# Patient Record
Sex: Female | Born: 1935 | Race: White | Hispanic: No
Health system: Southern US, Community
[De-identification: ages and names within clinical notes are randomized; demographics above are authoritative.]

---

## 2007-09-20 ENCOUNTER — Emergency Department: Payer: Self-pay | Admitting: Emergency Medicine

## 2007-09-20 ENCOUNTER — Other Ambulatory Visit: Payer: Self-pay

## 2007-09-23 ENCOUNTER — Ambulatory Visit: Payer: Self-pay | Admitting: Unknown Physician Specialty

## 2008-06-29 ENCOUNTER — Ambulatory Visit: Payer: Self-pay | Admitting: Podiatry

## 2009-07-16 ENCOUNTER — Emergency Department: Payer: Self-pay | Admitting: Emergency Medicine

## 2009-07-18 ENCOUNTER — Inpatient Hospital Stay: Payer: Self-pay | Admitting: Internal Medicine

## 2010-04-28 ENCOUNTER — Observation Stay: Payer: Self-pay

## 2011-01-31 ENCOUNTER — Other Ambulatory Visit: Payer: Self-pay | Admitting: Podiatry

## 2011-04-25 ENCOUNTER — Inpatient Hospital Stay: Payer: Self-pay | Admitting: Unknown Physician Specialty

## 2011-05-02 DIAGNOSIS — N390 Urinary tract infection, site not specified: Secondary | ICD-10-CM

## 2011-05-02 DIAGNOSIS — S72009A Fracture of unspecified part of neck of unspecified femur, initial encounter for closed fracture: Secondary | ICD-10-CM

## 2011-05-02 DIAGNOSIS — F05 Delirium due to known physiological condition: Secondary | ICD-10-CM

## 2011-05-02 DIAGNOSIS — I2510 Atherosclerotic heart disease of native coronary artery without angina pectoris: Secondary | ICD-10-CM

## 2011-05-10 DIAGNOSIS — L22 Diaper dermatitis: Secondary | ICD-10-CM

## 2011-06-01 DIAGNOSIS — E119 Type 2 diabetes mellitus without complications: Secondary | ICD-10-CM

## 2011-06-01 DIAGNOSIS — I10 Essential (primary) hypertension: Secondary | ICD-10-CM

## 2011-06-01 DIAGNOSIS — I2510 Atherosclerotic heart disease of native coronary artery without angina pectoris: Secondary | ICD-10-CM

## 2011-06-25 ENCOUNTER — Inpatient Hospital Stay: Payer: Self-pay | Admitting: Specialist

## 2011-06-26 ENCOUNTER — Telehealth: Payer: Self-pay | Admitting: *Deleted

## 2011-06-26 NOTE — Telephone Encounter (Signed)
Was sent to ER and admitted for pyelonephritis

## 2011-06-26 NOTE — Telephone Encounter (Signed)
Triage Record Num: [id number] Operator: Audelia Hives Patient Name: Felicia Casey Call Date & Time: 06/25/2011 7:03:58PM Patient Phone: [phone] PCP: Patient Gender: Female PCP Fax : Patient DOB: 02/24/1917 [facility] Name: [facility] Reason for Call: temp 101/3. elevated BP Emergent call to RN, after dinner pt started with fever of 101.3 ax, has a thready pulse, and BP 198/82, RR 22, pulse rate 90, pt is shaking and wants to send to the ED. Unable to get O2 sat on pt. States giving Tylenol at this time. Advised to send to ED and notify family and compliant. Protocol(s) Used: Office Note Recommended Outcome per Protocol: Information Noted and Sent to Office Reason for Outcome: Caller information to office Care Advice: ~ 06/25/2011 7:21:34PM Page 1 of 1 [email or web address]_[email or web address]

## 2011-07-03 DIAGNOSIS — N390 Urinary tract infection, site not specified: Secondary | ICD-10-CM

## 2011-07-03 DIAGNOSIS — S7290XA Unspecified fracture of unspecified femur, initial encounter for closed fracture: Secondary | ICD-10-CM

## 2011-07-03 DIAGNOSIS — I2510 Atherosclerotic heart disease of native coronary artery without angina pectoris: Secondary | ICD-10-CM

## 2011-07-03 DIAGNOSIS — E119 Type 2 diabetes mellitus without complications: Secondary | ICD-10-CM

## 2011-07-04 DIAGNOSIS — L97509 Non-pressure chronic ulcer of other part of unspecified foot with unspecified severity: Secondary | ICD-10-CM

## 2011-07-04 DIAGNOSIS — E1169 Type 2 diabetes mellitus with other specified complication: Secondary | ICD-10-CM

## 2011-07-20 DIAGNOSIS — R509 Fever, unspecified: Secondary | ICD-10-CM

## 2011-07-25 ENCOUNTER — Telehealth: Payer: Self-pay | Admitting: *Deleted

## 2011-07-25 NOTE — Telephone Encounter (Signed)
[facility] Triage Call Report Triage Record Num: [id number] Operator: Elita Boone Patient Name: Felicia Casey Call Date & Time: 07/22/2011 11:23:30AM Patient Phone: [phone] PCP: Patient Gender: Female PCP Fax : Patient DOB: [birth year] Practice Name: [facility] Reason for Call: [name], , calling regarding . PCP is . Callback number is [phone]. Laura,LPN/ [facility] is call about a UA results, positive nitrate and leukocytedone on 08/27. Urine has large amt of blood. Pt is afebrile. Standing orders given for UTI symptoms and no fever. Obtain U/A C&S and start Ceftin 250 mg 1 po BID x 7 days or until C&S is reported and facility to call PCP next business day. Protocol(s) Used: Medication Question Calls, No Triage (Adults) Recommended Outcome per Protocol: Provide Information or Advice Only Reason for Outcome: Caller has medication question only and triager answers question Care Advice: ~ 07/22/2011 11:37:06AM Page 1 of 1 [email or web address]_[email or web address]

## 2011-07-25 NOTE — Telephone Encounter (Signed)
Noted Will review at [facility]

## 2011-08-07 ENCOUNTER — Telehealth: Payer: Self-pay | Admitting: *Deleted

## 2011-08-07 NOTE — Telephone Encounter (Signed)
Seen today at [facility] Better today Will await results of urine but not clear Rx appropriate even if positive

## 2011-08-07 NOTE — Telephone Encounter (Signed)
[facility] Triage Call Report Triage Record Num: [id number] Operator: Alphonsa Overall Patient Name: Felicia Casey Call Date & Time: 08/06/2011 5:28:43PM Patient Phone: [phone] PCP: Tillman Abide Patient Gender: Female PCP Fax : [phone] Patient DOB: [birth year] Practice Name: [facility] Reason for Call: [name], LPN, , calling regarding . PCP is Alphonsus Sias ([facility]), Garnetta Buddy number is [phone]. [name] LPN/[facility] calling about confusion recent UTI. Onset 08/06/11 new confusion. Pt finished course of Ceftin on 07/29/11 for UTI. No UTI symptoms noted. 98.8 temp ax at 1640 after 1 hour Tylenol. BP 198/80, 22R, P72. 95percent room air, Glucose 135. Headache dull 08/06/11. DNR. ED per Dr Caryl Never for more evaluation. [name] aware and will discuss with family. Protocol(s) Used: Confusion, Disorientation, Agitation Recommended Outcome per Protocol: Activate EMS 911 Reason for Outcome: New or worsening confusion, disorientation or agitation AND fever, headache OR stiff neck Care Advice: ~ Protect the patient from falling or other harm. ~ Do not give the patient anything to eat or drink. ~ An adult should stay with the patient, preferably one trained in CPR. ~ IMMEDIATE ACTION Write down provider's name. List or place the following in a bag for transport with the patient: current prescription and/or nonprescription medications; alternative treatments, therapies and medications; and street drugs. ~ 08/06/2011 5:50:56PM Page 1 of 1 [email or web address]_[email or web address]

## 2011-08-08 ENCOUNTER — Ambulatory Visit: Payer: Self-pay | Admitting: Internal Medicine

## 2011-08-09 DIAGNOSIS — N3000 Acute cystitis without hematuria: Secondary | ICD-10-CM

## 2011-08-16 ENCOUNTER — Telehealth: Payer: Self-pay | Admitting: *Deleted

## 2011-08-16 MED ORDER — ALUMINUM HYDROXIDE EX OINT: TOPICAL_OINTMENT | Freq: Three times a day (TID) | CUTANEOUS | Status: AC | PRN

## 2011-08-16 NOTE — Telephone Encounter (Signed)
Okay to fill without refills She is no longer under my care since leaving [facility] but she was using this  Apply tid as directed to protect skin

## 2011-08-16 NOTE — Telephone Encounter (Signed)
rx sent to pharmacy by e-script  

## 2011-08-16 NOTE — Telephone Encounter (Signed)
Received faxed refill request.  This medication was not on med list, please advise.  Fax in your IN box.

## 2011-08-17 ENCOUNTER — Encounter: Payer: Self-pay | Admitting: Internal Medicine

## 2011-08-21 ENCOUNTER — Other Ambulatory Visit: Payer: Self-pay | Admitting: *Deleted

## 2011-08-21 MED ORDER — POLYVINYL ALCOHOL 1.4 % OP SOLN: 1.0000 [drp] | OPHTHALMIC | Status: AC | PRN

## 2011-08-29 ENCOUNTER — Other Ambulatory Visit: Payer: Self-pay | Admitting: *Deleted

## 2011-08-29 NOTE — Telephone Encounter (Signed)
Fax asking for Vitamin d2 50,000 unit capsule. I don't see this on her med list, actually we don't have a lot of information on this patient. Please advise, nursing home?

## 2011-08-30 NOTE — Telephone Encounter (Signed)
Faxed rx back to pharmacy letting them know she's no longer Dr.Letvak's patient, also sent same fax to [facility].

## 2011-08-30 NOTE — Telephone Encounter (Signed)
She has left [facility] and is at [facility] She is no longer under my care and they should send requests to [facility] ---or find out her new doctor

## 2011-09-01 ENCOUNTER — Emergency Department: Payer: Self-pay | Admitting: Emergency Medicine

## 2011-09-13 ENCOUNTER — Other Ambulatory Visit: Payer: Self-pay | Admitting: Podiatry

## 2011-09-20 ENCOUNTER — Other Ambulatory Visit: Payer: Self-pay | Admitting: Podiatry

## 2011-10-10 ENCOUNTER — Ambulatory Visit: Payer: Self-pay | Admitting: Podiatry

## 2011-12-08 ENCOUNTER — Emergency Department: Payer: Self-pay | Admitting: Emergency Medicine

## 2011-12-08 LAB — URINALYSIS, COMPLETE
Glucose,UR: NEGATIVE mg/dL (ref 0–75)
Nitrite: POSITIVE
RBC,UR: 110 /HPF (ref 0–5)
Specific Gravity: 1.012 (ref 1.003–1.030)
Squamous Epithelial: NONE SEEN
WBC UR: 2570 /HPF (ref 0–5)

## 2011-12-08 LAB — COMPREHENSIVE METABOLIC PANEL
Albumin: 2.6 g/dL — ABNORMAL LOW (ref 3.4–5.0)
Alkaline Phosphatase: 87 U/L (ref 50–136)
Anion Gap: 10 (ref 7–16)
BUN: 32 mg/dL — ABNORMAL HIGH (ref 7–18)
Bilirubin,Total: 0.4 mg/dL (ref 0.2–1.0)
Creatinine: 1.91 mg/dL — ABNORMAL HIGH (ref 0.60–1.30)
EGFR (Non-African Amer.): 26 — ABNORMAL LOW
Glucose: 105 mg/dL — ABNORMAL HIGH (ref 65–99)
Osmolality: 290 (ref 275–301)
Potassium: 5.1 mmol/L (ref 3.5–5.1)
Sodium: 142 mmol/L (ref 136–145)
Total Protein: 6.9 g/dL (ref 6.4–8.2)

## 2011-12-08 LAB — CBC
HCT: 27.1 % — ABNORMAL LOW (ref 35.0–47.0)
HGB: 9 g/dL — ABNORMAL LOW (ref 12.0–16.0)
MCH: 28.9 pg (ref 26.0–34.0)
MCHC: 33.1 g/dL (ref 32.0–36.0)
MCV: 87 fL (ref 80–100)
RDW: 17.3 % — ABNORMAL HIGH (ref 11.5–14.5)

## 2012-01-15 ENCOUNTER — Encounter: Payer: Self-pay | Admitting: Nurse Practitioner

## 2012-01-15 ENCOUNTER — Encounter: Payer: Self-pay | Admitting: Cardiothoracic Surgery

## 2012-01-19 ENCOUNTER — Encounter: Payer: Self-pay | Admitting: Cardiothoracic Surgery

## 2012-01-19 ENCOUNTER — Encounter: Payer: Self-pay | Admitting: Nurse Practitioner

## 2012-02-13 ENCOUNTER — Other Ambulatory Visit: Payer: Medicare Other

## 2012-02-15 ENCOUNTER — Other Ambulatory Visit: Payer: Medicare Other

## 2012-02-19 ENCOUNTER — Encounter: Payer: Self-pay | Admitting: Cardiothoracic Surgery

## 2012-02-19 ENCOUNTER — Encounter: Payer: Self-pay | Admitting: Nurse Practitioner

## 2012-02-22 ENCOUNTER — Ambulatory Visit: Payer: Medicare Other | Admitting: Internal Medicine

## 2012-02-27 LAB — WOUND CULTURE

## 2012-03-11 ENCOUNTER — Other Ambulatory Visit: Payer: Medicare Other

## 2012-03-20 ENCOUNTER — Encounter: Payer: Self-pay | Admitting: Cardiothoracic Surgery

## 2012-03-20 ENCOUNTER — Encounter: Payer: Self-pay | Admitting: Nurse Practitioner

## 2012-04-10 IMAGING — CT CT HEAD WITHOUT CONTRAST
2 series · 15 of 30 positions shown, 19 images · non-contrast
Comparison: none

REASON FOR EXAM: CVA
COMMENTS:   May transport without cardiac monitor

PROCEDURE:     CT  - CT HEAD WITHOUT CONTRAST  - December 08, 2011  [DATE]
RESULT:     Comparison:  09/20/2007
TECHNIQUE: Multiple axial images from the foramen magnum to the vertex were
obtained without IV contrast.

[Series 2: without · axial · non-contrast · 0.42mm/px · z∈[+296,+422]mm · 13 of 31 slices shown, 17 images]
[im 3/31  brain]
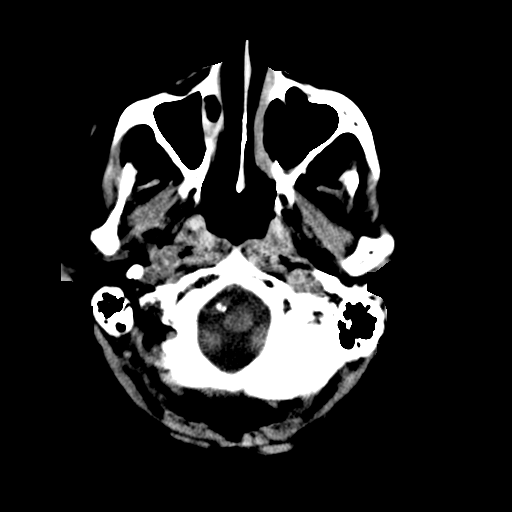
[im 3/31  bone]
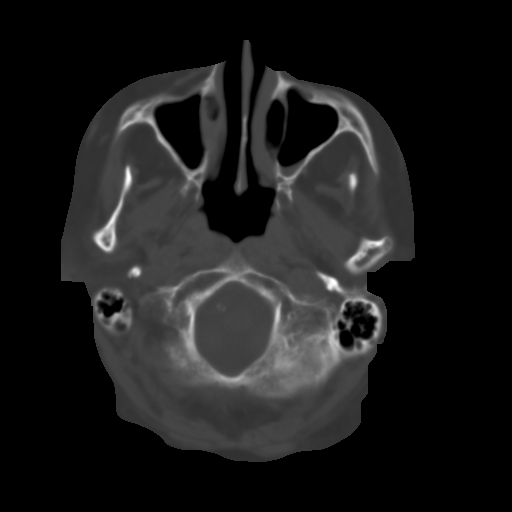
[im 5/31  brain]
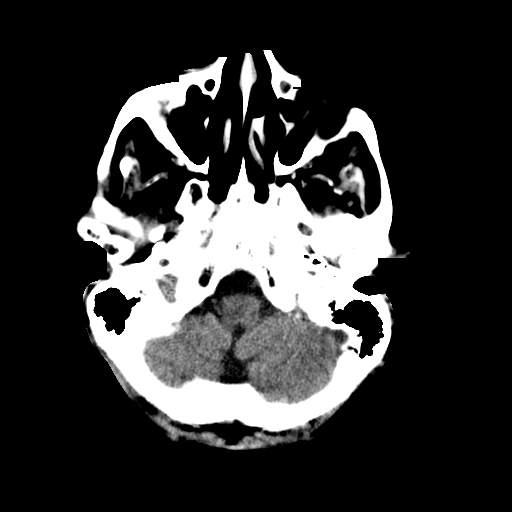
[im 7/31  brain]
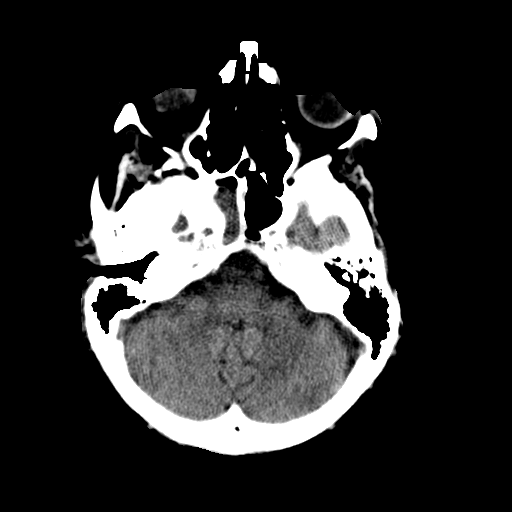
[im 9/31  brain]
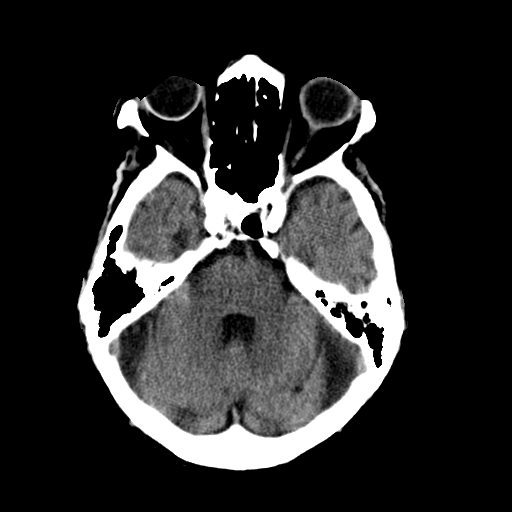
[im 11/31  brain]
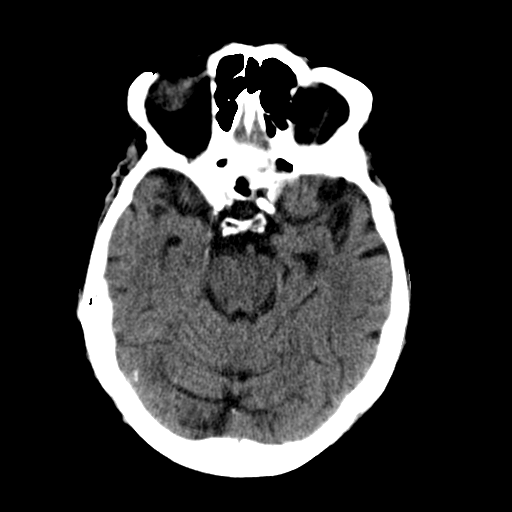
[im 11/31  bone]
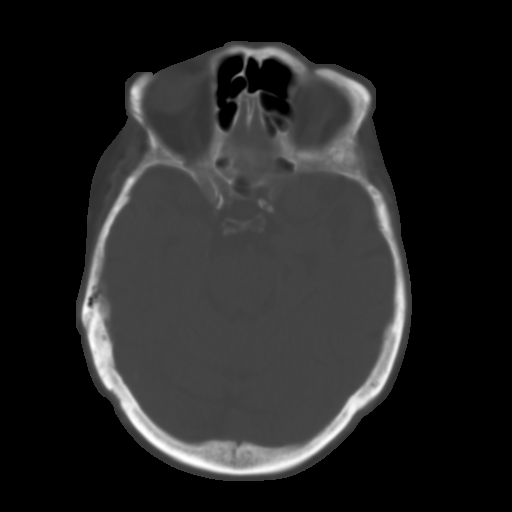
[im 13/31  brain]
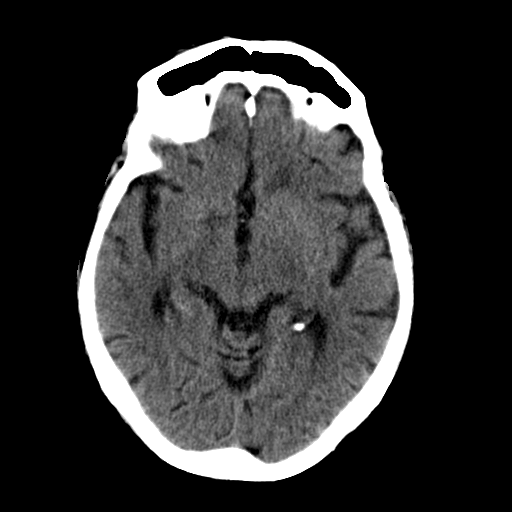
[im 16/31  brain]
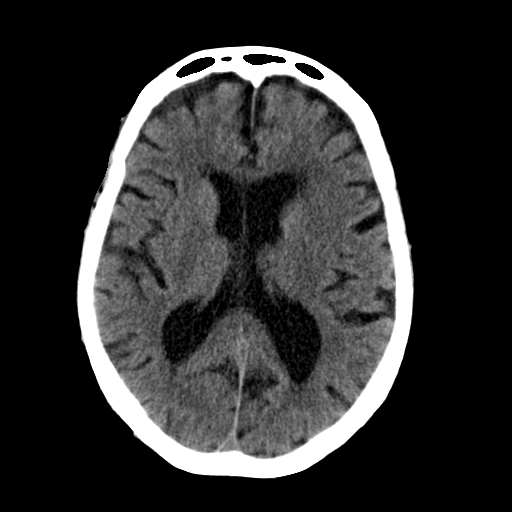
[im 18/31  brain]
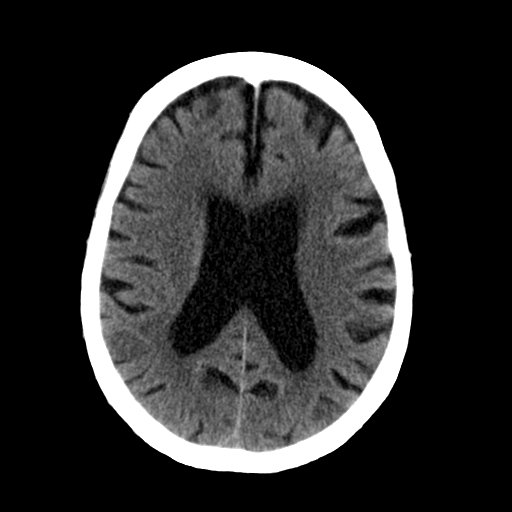
[im 20/31  brain]
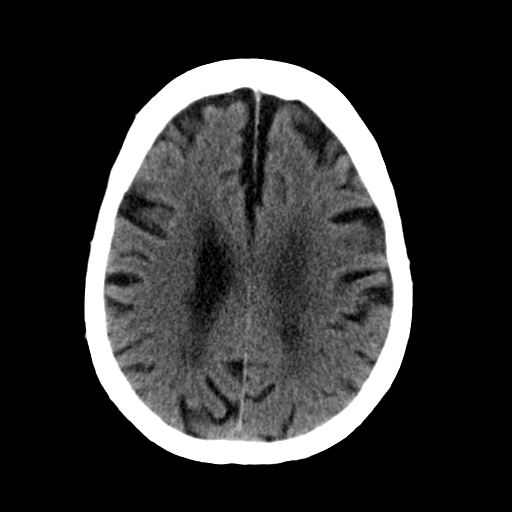
[im 20/31  bone]
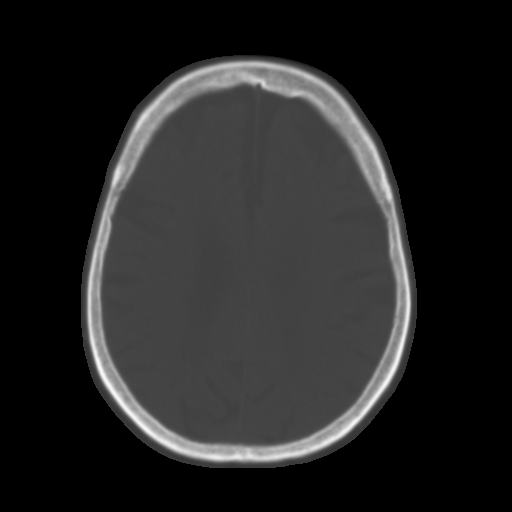
[im 22/31  brain]
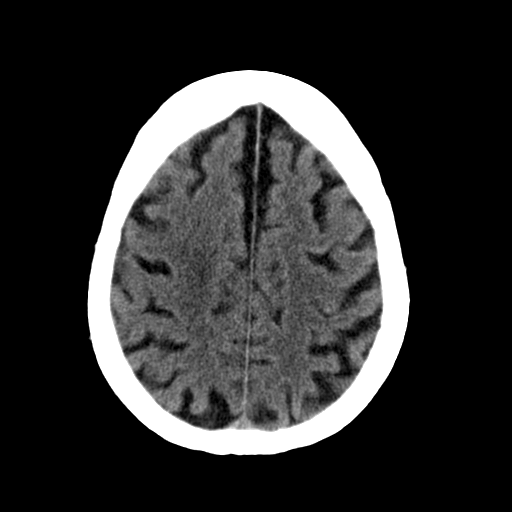
[im 24/31  brain]
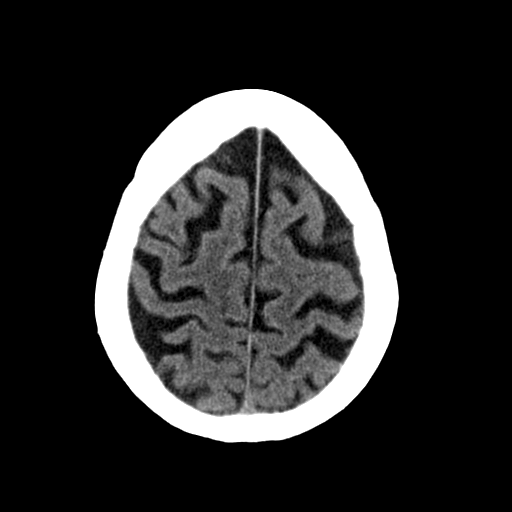
[im 26/31  brain]
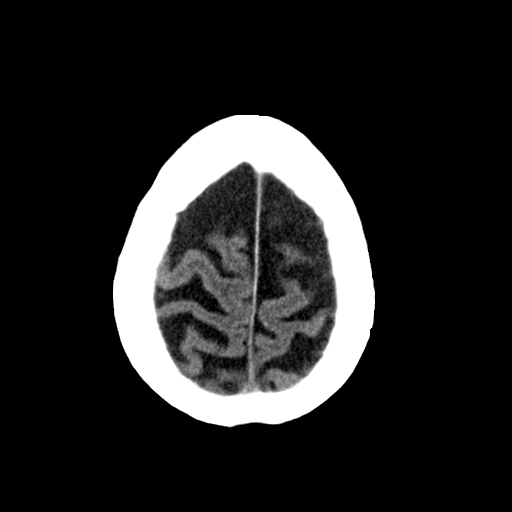
[im 28/31  brain]
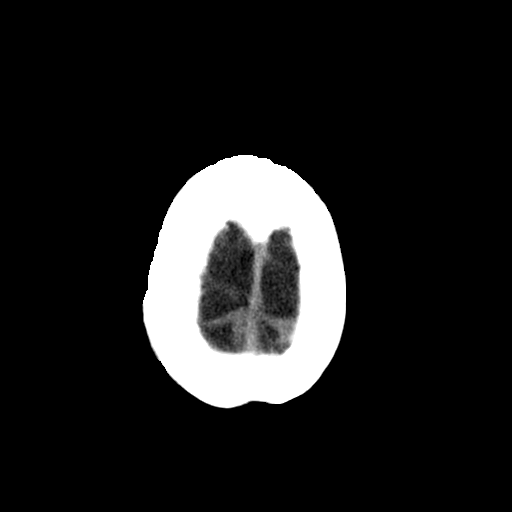
[im 28/31  bone]
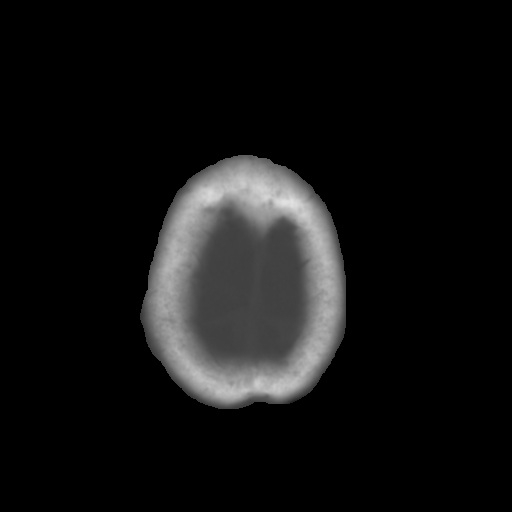

[Series 3: bone · axial · 0.42mm/px · z∈[+296,+316]mm · 2 of 31 slices shown]
[im 3/31  bone]
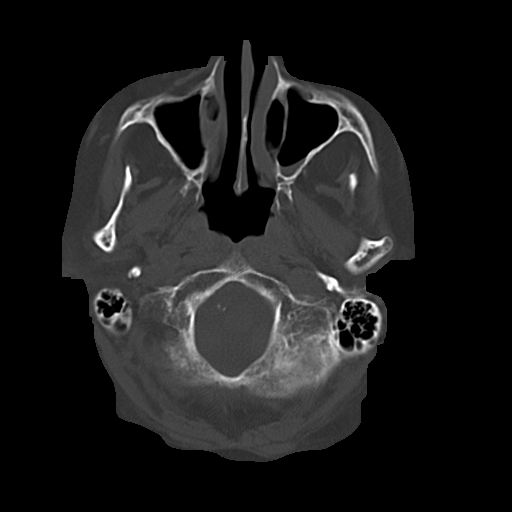
[im 7/31  bone]
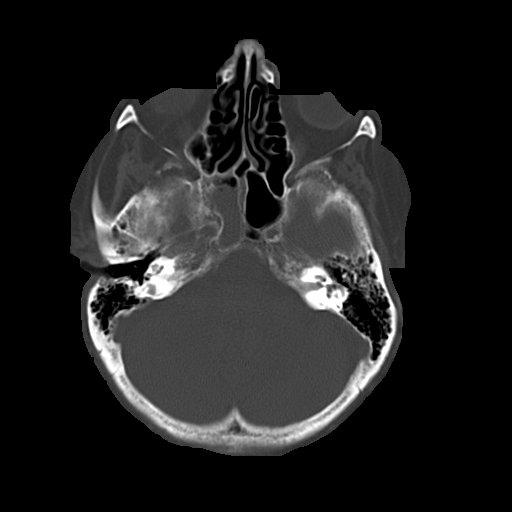

[15 of 30 positions shown; findings below may reference images not displayed]

FINDINGS: There is no evidence for mass effect, midline shift, or extra-axial fluid
collections. There is no evidence for space-occupying lesion, intracranial
hemorrhage, or cortical-based area of infarction. There is mild diffuse
cerebral atrophy, which is age-appropriate. Mild periventricular and
subcortical hypoattenuation is consistent with sequela of chronic
microangiopathy. There is an old small lacunar infarct in the right centrum
semiovale, similar to prior.

There is near complete opacification the right sphenoid sinus.

The osseous structures are unremarkable.
IMPRESSION: 1. No acute intracranial process.
2. Mild chronic microangiopathy.
3. Right sphenoid sinus disease.

CT can underestimate ischemia in the first 24 hours after the event. If
there is clinical concern for an acute infarct, a followup MRI or repeat CT
scan in 24 hours may provide additional information.

## 2012-05-03 ENCOUNTER — Encounter: Payer: Self-pay | Admitting: Cardiothoracic Surgery

## 2012-05-21 ENCOUNTER — Inpatient Hospital Stay: Payer: Self-pay

## 2012-05-21 LAB — URINALYSIS, COMPLETE: Specific Gravity: 1.016 (ref 1.003–1.030)

## 2012-05-21 LAB — COMPREHENSIVE METABOLIC PANEL
BUN: 37 mg/dL — ABNORMAL HIGH (ref 7–18)
Bilirubin,Total: 0.5 mg/dL (ref 0.2–1.0)
Chloride: 104 mmol/L (ref 98–107)
Co2: 24 mmol/L (ref 21–32)
Creatinine: 2.31 mg/dL — ABNORMAL HIGH (ref 0.60–1.30)
EGFR (African American): 21 — ABNORMAL LOW
EGFR (Non-African Amer.): 18 — ABNORMAL LOW
Osmolality: 279 (ref 275–301)
Potassium: 5.5 mmol/L — ABNORMAL HIGH (ref 3.5–5.1)
SGPT (ALT): 13 U/L
Sodium: 134 mmol/L — ABNORMAL LOW (ref 136–145)

## 2012-05-21 LAB — CBC
HCT: 27.2 % — ABNORMAL LOW (ref 35.0–47.0)
HGB: 8.6 g/dL — ABNORMAL LOW (ref 12.0–16.0)
MCH: 27.5 pg (ref 26.0–34.0)
MCHC: 31.6 g/dL — ABNORMAL LOW (ref 32.0–36.0)

## 2012-05-22 LAB — BASIC METABOLIC PANEL
Chloride: 107 mmol/L (ref 98–107)
Co2: 20 mmol/L — ABNORMAL LOW (ref 21–32)
Creatinine: 2.44 mg/dL — ABNORMAL HIGH (ref 0.60–1.30)
Osmolality: 287 (ref 275–301)
Sodium: 137 mmol/L (ref 136–145)

## 2012-05-23 LAB — BASIC METABOLIC PANEL
BUN: 34 mg/dL — ABNORMAL HIGH (ref 7–18)
Calcium, Total: 8.5 mg/dL (ref 8.5–10.1)
Chloride: 108 mmol/L — ABNORMAL HIGH (ref 98–107)
Creatinine: 2.08 mg/dL — ABNORMAL HIGH (ref 0.60–1.30)
Potassium: 4.6 mmol/L (ref 3.5–5.1)
Sodium: 137 mmol/L (ref 136–145)

## 2012-05-23 LAB — CBC WITH DIFFERENTIAL/PLATELET
Basophil %: 0.2 %
Eosinophil #: 0.3 10*3/uL (ref 0.0–0.7)
HGB: 6.3 g/dL — ABNORMAL LOW (ref 12.0–16.0)
Lymphocyte #: 1.2 10*3/uL (ref 1.0–3.6)
Lymphocyte %: 7.8 %
MCHC: 31.1 g/dL — ABNORMAL LOW (ref 32.0–36.0)
MCV: 87 fL (ref 80–100)
Neutrophil %: 85.5 %
Platelet: 139 10*3/uL — ABNORMAL LOW (ref 150–440)
RBC: 2.32 10*6/uL — ABNORMAL LOW (ref 3.80–5.20)
RDW: 17.4 % — ABNORMAL HIGH (ref 11.5–14.5)

## 2012-05-24 LAB — CBC WITH DIFFERENTIAL/PLATELET
Basophil #: 0 10*3/uL (ref 0.0–0.1)
Eosinophil #: 0.4 10*3/uL (ref 0.0–0.7)
HCT: 26.9 % — ABNORMAL LOW (ref 35.0–47.0)
HGB: 8.7 g/dL — ABNORMAL LOW (ref 12.0–16.0)
Lymphocyte #: 1.1 10*3/uL (ref 1.0–3.6)
Lymphocyte %: 7.9 %
MCH: 27.8 pg (ref 26.0–34.0)
Monocyte #: 0.8 x10 3/mm (ref 0.2–0.9)
Neutrophil %: 82.8 %
RBC: 3.11 10*6/uL — ABNORMAL LOW (ref 3.80–5.20)

## 2012-05-24 LAB — BASIC METABOLIC PANEL
BUN: 26 mg/dL — ABNORMAL HIGH (ref 7–18)
Calcium, Total: 8.9 mg/dL (ref 8.5–10.1)
EGFR (African American): 31 — ABNORMAL LOW
EGFR (Non-African Amer.): 26 — ABNORMAL LOW
Osmolality: 280 (ref 275–301)
Potassium: 4.3 mmol/L (ref 3.5–5.1)

## 2012-05-25 LAB — BASIC METABOLIC PANEL
Anion Gap: 4 — ABNORMAL LOW (ref 7–16)
Calcium, Total: 8.5 mg/dL (ref 8.5–10.1)
Chloride: 111 mmol/L — ABNORMAL HIGH (ref 98–107)
Co2: 25 mmol/L (ref 21–32)
Creatinine: 1.65 mg/dL — ABNORMAL HIGH (ref 0.60–1.30)
EGFR (African American): 31 — ABNORMAL LOW
Glucose: 104 mg/dL — ABNORMAL HIGH (ref 65–99)
Osmolality: 283 (ref 275–301)
Potassium: 4.5 mmol/L (ref 3.5–5.1)
Sodium: 140 mmol/L (ref 136–145)

## 2012-05-25 LAB — CBC WITH DIFFERENTIAL/PLATELET
Basophil %: 0.3 %
Eosinophil %: 6.9 %
HGB: 8.6 g/dL — ABNORMAL LOW (ref 12.0–16.0)
Lymphocyte #: 1.2 10*3/uL (ref 1.0–3.6)
MCH: 27.9 pg (ref 26.0–34.0)
MCHC: 32.3 g/dL (ref 32.0–36.0)
Monocyte #: 0.7 x10 3/mm (ref 0.2–0.9)
Monocyte %: 7.5 %
Neutrophil #: 7 10*3/uL — ABNORMAL HIGH (ref 1.4–6.5)
Neutrophil %: 72.4 %
RBC: 3.08 10*6/uL — ABNORMAL LOW (ref 3.80–5.20)
WBC: 9.7 10*3/uL (ref 3.6–11.0)

## 2012-05-25 LAB — VANCOMYCIN, TROUGH: Vancomycin, Trough: 11 ug/mL (ref 10–20)

## 2012-05-26 LAB — URINE CULTURE

## 2012-05-26 LAB — BASIC METABOLIC PANEL
Anion Gap: 6 — ABNORMAL LOW (ref 7–16)
Chloride: 110 mmol/L — ABNORMAL HIGH (ref 98–107)
Co2: 24 mmol/L (ref 21–32)
Creatinine: 1.52 mg/dL — ABNORMAL HIGH (ref 0.60–1.30)
EGFR (African American): 34 — ABNORMAL LOW
Sodium: 140 mmol/L (ref 136–145)

## 2012-05-26 LAB — CBC WITH DIFFERENTIAL/PLATELET
Basophil #: 0 10*3/uL (ref 0.0–0.1)
Basophil %: 0.3 %
HCT: 26.9 % — ABNORMAL LOW (ref 35.0–47.0)
HGB: 8.8 g/dL — ABNORMAL LOW (ref 12.0–16.0)
Lymphocyte %: 17 %
MCHC: 32.7 g/dL (ref 32.0–36.0)
Monocyte #: 0.9 x10 3/mm (ref 0.2–0.9)
Monocyte %: 9.9 %
Neutrophil #: 5.9 10*3/uL (ref 1.4–6.5)
Neutrophil %: 66.6 %
RDW: 16.3 % — ABNORMAL HIGH (ref 11.5–14.5)
WBC: 8.9 10*3/uL (ref 3.6–11.0)

## 2012-05-27 LAB — BASIC METABOLIC PANEL
Anion Gap: 10 (ref 7–16)
BUN: 22 mg/dL — ABNORMAL HIGH (ref 7–18)
Chloride: 106 mmol/L (ref 98–107)
Creatinine: 1.38 mg/dL — ABNORMAL HIGH (ref 0.60–1.30)
EGFR (African American): 38 — ABNORMAL LOW
Glucose: 115 mg/dL — ABNORMAL HIGH (ref 65–99)
Osmolality: 280 (ref 275–301)
Sodium: 138 mmol/L (ref 136–145)

## 2012-05-27 LAB — CULTURE, BLOOD (SINGLE)

## 2012-05-27 LAB — CBC WITH DIFFERENTIAL/PLATELET
Basophil #: 0 10*3/uL (ref 0.0–0.1)
Basophil %: 0.3 %
Eosinophil %: 5.1 %
Lymphocyte #: 2.1 10*3/uL (ref 1.0–3.6)
Lymphocyte %: 19.8 %
MCHC: 32.4 g/dL (ref 32.0–36.0)
MCV: 88 fL (ref 80–100)
Platelet: 167 10*3/uL (ref 150–440)
RBC: 3.29 10*6/uL — ABNORMAL LOW (ref 3.80–5.20)
RDW: 16 % — ABNORMAL HIGH (ref 11.5–14.5)
WBC: 10.5 10*3/uL (ref 3.6–11.0)

## 2012-05-28 LAB — BASIC METABOLIC PANEL
Anion Gap: 10 (ref 7–16)
BUN: 19 mg/dL — ABNORMAL HIGH (ref 7–18)
Calcium, Total: 8.8 mg/dL (ref 8.5–10.1)
Chloride: 108 mmol/L — ABNORMAL HIGH (ref 98–107)
Co2: 21 mmol/L (ref 21–32)
EGFR (African American): 35 — ABNORMAL LOW
EGFR (Non-African Amer.): 30 — ABNORMAL LOW
Osmolality: 281 (ref 275–301)
Potassium: 4.2 mmol/L (ref 3.5–5.1)

## 2012-05-28 LAB — CBC WITH DIFFERENTIAL/PLATELET
Basophil #: 0 10*3/uL (ref 0.0–0.1)
Basophil %: 0.3 %
Eosinophil #: 0.5 10*3/uL (ref 0.0–0.7)
HCT: 27.5 % — ABNORMAL LOW (ref 35.0–47.0)
HGB: 9 g/dL — ABNORMAL LOW (ref 12.0–16.0)
Lymphocyte %: 16.4 %
MCHC: 32.6 g/dL (ref 32.0–36.0)
MCV: 86 fL (ref 80–100)
Monocyte #: 0.9 x10 3/mm (ref 0.2–0.9)
Monocyte %: 8.8 %
Neutrophil #: 7.1 10*3/uL — ABNORMAL HIGH (ref 1.4–6.5)
Neutrophil %: 69.2 %
RBC: 3.19 10*6/uL — ABNORMAL LOW (ref 3.80–5.20)
WBC: 10.2 10*3/uL (ref 3.6–11.0)

## 2012-05-30 ENCOUNTER — Encounter: Payer: Self-pay | Admitting: Cardiothoracic Surgery

## 2012-06-06 ENCOUNTER — Encounter: Payer: Self-pay | Admitting: Internal Medicine

## 2012-06-06 LAB — BASIC METABOLIC PANEL WITH GFR
Anion Gap: 8
BUN: 28 mg/dL — ABNORMAL HIGH
Calcium, Total: 9.1 mg/dL
Chloride: 105 mmol/L
Co2: 25 mmol/L
Creatinine: 1.7 mg/dL — ABNORMAL HIGH
EGFR (African American): 30 — ABNORMAL LOW
EGFR (Non-African Amer.): 26 — ABNORMAL LOW
Glucose: 127 mg/dL — ABNORMAL HIGH
Osmolality: 283
Potassium: 4.3 mmol/L
Sodium: 138 mmol/L

## 2012-06-13 ENCOUNTER — Emergency Department: Payer: Self-pay | Admitting: Emergency Medicine

## 2012-06-13 LAB — BASIC METABOLIC PANEL
Anion Gap: 11 (ref 7–16)
BUN: 33 mg/dL — ABNORMAL HIGH (ref 7–18)
Calcium, Total: 9.4 mg/dL (ref 8.5–10.1)
Chloride: 108 mmol/L — ABNORMAL HIGH (ref 98–107)
Co2: 22 mmol/L (ref 21–32)
Creatinine: 1.49 mg/dL — ABNORMAL HIGH (ref 0.60–1.30)
Potassium: 4.9 mmol/L (ref 3.5–5.1)
Sodium: 141 mmol/L (ref 136–145)

## 2012-06-16 LAB — URINALYSIS, COMPLETE
Nitrite: NEGATIVE
Ph: 5 (ref 4.5–8.0)
Protein: 100
RBC,UR: 2887 /HPF (ref 0–5)
Squamous Epithelial: 5
Transitional Epi: 3

## 2012-06-18 LAB — URINE CULTURE

## 2012-06-20 ENCOUNTER — Encounter: Payer: Self-pay | Admitting: Internal Medicine

## 2012-07-16 ENCOUNTER — Inpatient Hospital Stay: Payer: Self-pay

## 2012-07-17 LAB — CBC WITH DIFFERENTIAL/PLATELET
Basophil #: 0 10*3/uL (ref 0.0–0.1)
Basophil %: 0.4 %
HCT: 21.7 % — ABNORMAL LOW (ref 35.0–47.0)
HGB: 7.1 g/dL — ABNORMAL LOW (ref 12.0–16.0)
Lymphocyte %: 11 %
MCH: 28.2 pg (ref 26.0–34.0)
MCHC: 32.7 g/dL (ref 32.0–36.0)
MCV: 86 fL (ref 80–100)
Monocyte #: 1.2 x10 3/mm — ABNORMAL HIGH (ref 0.2–0.9)
Neutrophil #: 9.6 10*3/uL — ABNORMAL HIGH (ref 1.4–6.5)
RDW: 17 % — ABNORMAL HIGH (ref 11.5–14.5)
WBC: 12.3 10*3/uL — ABNORMAL HIGH (ref 3.6–11.0)

## 2012-07-17 LAB — URINALYSIS, COMPLETE
Bilirubin,UR: NEGATIVE
Glucose,UR: NEGATIVE mg/dL (ref 0–75)
Ketone: NEGATIVE
Nitrite: NEGATIVE
Ph: 5 (ref 4.5–8.0)
RBC,UR: 663 /HPF (ref 0–5)
Squamous Epithelial: 4
WBC UR: 3958 /HPF (ref 0–5)

## 2012-07-17 LAB — BASIC METABOLIC PANEL
BUN: 38 mg/dL — ABNORMAL HIGH (ref 7–18)
Calcium, Total: 8.6 mg/dL (ref 8.5–10.1)
Creatinine: 2.77 mg/dL — ABNORMAL HIGH (ref 0.60–1.30)
EGFR (African American): 17 — ABNORMAL LOW
EGFR (Non-African Amer.): 14 — ABNORMAL LOW
Glucose: 100 mg/dL — ABNORMAL HIGH (ref 65–99)
Osmolality: 281 (ref 275–301)
Potassium: 4.9 mmol/L (ref 3.5–5.1)

## 2012-07-18 LAB — CBC WITH DIFFERENTIAL/PLATELET
Basophil #: 0.1 10*3/uL (ref 0.0–0.1)
Basophil %: 0.5 %
Eosinophil #: 0.3 10*3/uL (ref 0.0–0.7)
Eosinophil %: 2.4 %
HCT: 20.1 % — ABNORMAL LOW (ref 35.0–47.0)
HGB: 6.6 g/dL — ABNORMAL LOW (ref 12.0–16.0)
Lymphocyte #: 1.3 10*3/uL (ref 1.0–3.6)
Lymphocyte %: 11 %
Monocyte #: 1.2 x10 3/mm — ABNORMAL HIGH (ref 0.2–0.9)
Monocyte %: 10 %
Neutrophil #: 9.1 10*3/uL — ABNORMAL HIGH (ref 1.4–6.5)
Platelet: 143 10*3/uL — ABNORMAL LOW (ref 150–440)
RBC: 2.33 10*6/uL — ABNORMAL LOW (ref 3.80–5.20)
RDW: 17.1 % — ABNORMAL HIGH (ref 11.5–14.5)
WBC: 12 10*3/uL — ABNORMAL HIGH (ref 3.6–11.0)

## 2012-07-18 LAB — BASIC METABOLIC PANEL
BUN: 38 mg/dL — ABNORMAL HIGH (ref 7–18)
Calcium, Total: 8.4 mg/dL — ABNORMAL LOW (ref 8.5–10.1)
Co2: 19 mmol/L — ABNORMAL LOW (ref 21–32)
EGFR (African American): 15 — ABNORMAL LOW
EGFR (Non-African Amer.): 13 — ABNORMAL LOW
Glucose: 59 mg/dL — ABNORMAL LOW (ref 65–99)
Osmolality: 277 (ref 275–301)
Potassium: 4.5 mmol/L (ref 3.5–5.1)

## 2012-07-19 LAB — CBC WITH DIFFERENTIAL/PLATELET
Basophil #: 0 10*3/uL (ref 0.0–0.1)
Basophil %: 0.4 %
Eosinophil #: 0.6 10*3/uL (ref 0.0–0.7)
Eosinophil %: 4.6 %
Lymphocyte %: 15.3 %
MCH: 28.5 pg (ref 26.0–34.0)
MCHC: 33 g/dL (ref 32.0–36.0)
MCV: 86 fL (ref 80–100)
Monocyte #: 0.9 x10 3/mm (ref 0.2–0.9)
Monocyte %: 7 %
Neutrophil %: 72.7 %
Platelet: 161 10*3/uL (ref 150–440)
RBC: 2.81 10*6/uL — ABNORMAL LOW (ref 3.80–5.20)
WBC: 12.5 10*3/uL — ABNORMAL HIGH (ref 3.6–11.0)

## 2012-07-19 LAB — BASIC METABOLIC PANEL
Calcium, Total: 8.6 mg/dL (ref 8.5–10.1)
Chloride: 108 mmol/L — ABNORMAL HIGH (ref 98–107)
Co2: 20 mmol/L — ABNORMAL LOW (ref 21–32)
EGFR (African American): 18 — ABNORMAL LOW
Osmolality: 276 (ref 275–301)
Sodium: 136 mmol/L (ref 136–145)

## 2012-07-20 LAB — URINE CULTURE

## 2012-07-21 LAB — CBC WITH DIFFERENTIAL/PLATELET
Basophil #: 0 10*3/uL (ref 0.0–0.1)
Eosinophil #: 0.5 10*3/uL (ref 0.0–0.7)
Eosinophil %: 5.2 %
HCT: 24.2 % — ABNORMAL LOW (ref 35.0–47.0)
Lymphocyte #: 1.4 10*3/uL (ref 1.0–3.6)
Lymphocyte %: 15.6 %
MCHC: 33.9 g/dL (ref 32.0–36.0)
Monocyte %: 10.2 %
Neutrophil #: 6 10*3/uL (ref 1.4–6.5)
Platelet: 168 10*3/uL (ref 150–440)
RBC: 2.79 10*6/uL — ABNORMAL LOW (ref 3.80–5.20)
RDW: 16.5 % — ABNORMAL HIGH (ref 11.5–14.5)
WBC: 8.7 10*3/uL (ref 3.6–11.0)

## 2012-07-21 LAB — BASIC METABOLIC PANEL
BUN: 31 mg/dL — ABNORMAL HIGH (ref 7–18)
Chloride: 110 mmol/L — ABNORMAL HIGH (ref 98–107)
EGFR (Non-African Amer.): 21 — ABNORMAL LOW
Glucose: 104 mg/dL — ABNORMAL HIGH (ref 65–99)
Osmolality: 284 (ref 275–301)
Potassium: 4.4 mmol/L (ref 3.5–5.1)
Sodium: 139 mmol/L (ref 136–145)

## 2012-07-22 LAB — CBC WITH DIFFERENTIAL/PLATELET
Basophil %: 0.6 %
Eosinophil #: 0.6 10*3/uL (ref 0.0–0.7)
Eosinophil %: 7.5 %
HCT: 28.5 % — ABNORMAL LOW (ref 35.0–47.0)
HGB: 9.7 g/dL — ABNORMAL LOW (ref 12.0–16.0)
Lymphocyte %: 25.6 %
MCV: 86 fL (ref 80–100)
Monocyte #: 0.6 x10 3/mm (ref 0.2–0.9)
Monocyte %: 7.9 %
Neutrophil #: 4.5 10*3/uL (ref 1.4–6.5)
RBC: 3.32 10*6/uL — ABNORMAL LOW (ref 3.80–5.20)
RDW: 16 % — ABNORMAL HIGH (ref 11.5–14.5)
WBC: 7.7 10*3/uL (ref 3.6–11.0)

## 2012-07-22 LAB — BASIC METABOLIC PANEL
Calcium, Total: 9.6 mg/dL (ref 8.5–10.1)
Creatinine: 1.94 mg/dL — ABNORMAL HIGH (ref 0.60–1.30)
EGFR (African American): 25 — ABNORMAL LOW
EGFR (Non-African Amer.): 22 — ABNORMAL LOW
Sodium: 138 mmol/L (ref 136–145)

## 2012-07-24 LAB — BASIC METABOLIC PANEL
Anion Gap: 7 (ref 7–16)
Calcium, Total: 9.4 mg/dL (ref 8.5–10.1)
Chloride: 108 mmol/L — ABNORMAL HIGH (ref 98–107)
Creatinine: 2.08 mg/dL — ABNORMAL HIGH (ref 0.60–1.30)
EGFR (Non-African Amer.): 20 — ABNORMAL LOW
Glucose: 117 mg/dL — ABNORMAL HIGH (ref 65–99)
Osmolality: 284 (ref 275–301)
Potassium: 4.6 mmol/L (ref 3.5–5.1)

## 2012-07-24 LAB — CBC WITH DIFFERENTIAL/PLATELET
Basophil #: 0.1 10*3/uL (ref 0.0–0.1)
Basophil %: 0.5 %
Eosinophil #: 0.5 10*3/uL (ref 0.0–0.7)
Eosinophil %: 5.4 %
HCT: 27.8 % — ABNORMAL LOW (ref 35.0–47.0)
HGB: 9.3 g/dL — ABNORMAL LOW (ref 12.0–16.0)
Lymphocyte %: 18.3 %
MCHC: 33.6 g/dL (ref 32.0–36.0)
MCV: 85 fL (ref 80–100)
Monocyte #: 0.7 x10 3/mm (ref 0.2–0.9)
Monocyte %: 7.6 %
Neutrophil #: 6.5 10*3/uL (ref 1.4–6.5)

## 2012-07-25 ENCOUNTER — Encounter: Payer: Self-pay | Admitting: Internal Medicine

## 2012-08-03 LAB — URINALYSIS, COMPLETE
Bilirubin,UR: NEGATIVE
Glucose,UR: NEGATIVE mg/dL (ref 0–75)
Nitrite: NEGATIVE
RBC,UR: 212 /HPF (ref 0–5)
Specific Gravity: 1.009 (ref 1.003–1.030)
WBC UR: 489 /HPF (ref 0–5)

## 2012-08-06 LAB — URINE CULTURE

## 2012-08-14 ENCOUNTER — Encounter: Payer: Self-pay | Admitting: Cardiothoracic Surgery

## 2012-08-14 ENCOUNTER — Encounter: Payer: Self-pay | Admitting: Nurse Practitioner

## 2012-08-20 ENCOUNTER — Encounter: Payer: Self-pay | Admitting: Nurse Practitioner

## 2012-08-21 ENCOUNTER — Inpatient Hospital Stay: Payer: Self-pay

## 2012-08-21 LAB — URINALYSIS, COMPLETE
Bilirubin,UR: NEGATIVE
Glucose,UR: NEGATIVE mg/dL (ref 0–75)
Ketone: NEGATIVE
Ph: 6 (ref 4.5–8.0)
Specific Gravity: 1.013 (ref 1.003–1.030)
Squamous Epithelial: NONE SEEN

## 2012-08-21 LAB — COMPREHENSIVE METABOLIC PANEL
Alkaline Phosphatase: 126 U/L (ref 50–136)
Anion Gap: 8 (ref 7–16)
Calcium, Total: 9.6 mg/dL (ref 8.5–10.1)
Chloride: 109 mmol/L — ABNORMAL HIGH (ref 98–107)
EGFR (African American): 16 — ABNORMAL LOW
EGFR (Non-African Amer.): 14 — ABNORMAL LOW
Potassium: 5.7 mmol/L — ABNORMAL HIGH (ref 3.5–5.1)
SGOT(AST): 16 U/L (ref 15–37)
Sodium: 137 mmol/L (ref 136–145)

## 2012-08-21 LAB — CBC
HGB: 10.5 g/dL — ABNORMAL LOW (ref 12.0–16.0)
MCH: 29.4 pg (ref 26.0–34.0)
MCV: 86 fL (ref 80–100)
Platelet: 137 10*3/uL — ABNORMAL LOW (ref 150–440)
RBC: 3.57 10*6/uL — ABNORMAL LOW (ref 3.80–5.20)
RDW: 17 % — ABNORMAL HIGH (ref 11.5–14.5)
WBC: 14.9 10*3/uL — ABNORMAL HIGH (ref 3.6–11.0)

## 2012-08-21 LAB — LIPASE, BLOOD: Lipase: 198 U/L (ref 73–393)

## 2012-08-22 ENCOUNTER — Ambulatory Visit: Payer: Self-pay | Admitting: Internal Medicine

## 2012-08-22 LAB — BASIC METABOLIC PANEL
Anion Gap: 11 (ref 7–16)
BUN: 66 mg/dL — ABNORMAL HIGH (ref 7–18)
Chloride: 110 mmol/L — ABNORMAL HIGH (ref 98–107)
Creatinine: 2.5 mg/dL — ABNORMAL HIGH (ref 0.60–1.30)
Glucose: 96 mg/dL (ref 65–99)
Osmolality: 295 (ref 275–301)
Sodium: 138 mmol/L (ref 136–145)

## 2012-08-22 LAB — CBC WITH DIFFERENTIAL/PLATELET
Basophil %: 0.2 %
Eosinophil #: 0.1 10*3/uL (ref 0.0–0.7)
Eosinophil %: 1.3 %
HGB: 9.4 g/dL — ABNORMAL LOW (ref 12.0–16.0)
Lymphocyte #: 2.5 10*3/uL (ref 1.0–3.6)
Lymphocyte %: 23.7 %
MCH: 28.9 pg (ref 26.0–34.0)
MCHC: 33.7 g/dL (ref 32.0–36.0)
MCV: 86 fL (ref 80–100)
Monocyte #: 1.1 x10 3/mm — ABNORMAL HIGH (ref 0.2–0.9)
Neutrophil #: 6.9 10*3/uL — ABNORMAL HIGH (ref 1.4–6.5)
Neutrophil %: 64.9 %
Platelet: 120 10*3/uL — ABNORMAL LOW (ref 150–440)

## 2012-08-23 ENCOUNTER — Encounter: Payer: Self-pay | Admitting: Internal Medicine

## 2012-08-23 LAB — BASIC METABOLIC PANEL
BUN: 61 mg/dL — ABNORMAL HIGH (ref 7–18)
Calcium, Total: 8.7 mg/dL (ref 8.5–10.1)
Chloride: 112 mmol/L — ABNORMAL HIGH (ref 98–107)
Creatinine: 2.41 mg/dL — ABNORMAL HIGH (ref 0.60–1.30)
EGFR (African American): 19 — ABNORMAL LOW
EGFR (Non-African Amer.): 17 — ABNORMAL LOW
Potassium: 4.9 mmol/L (ref 3.5–5.1)
Sodium: 139 mmol/L (ref 136–145)

## 2012-08-23 LAB — CBC WITH DIFFERENTIAL/PLATELET
Basophil %: 0.5 %
Eosinophil %: 1.9 %
HGB: 9.3 g/dL — ABNORMAL LOW (ref 12.0–16.0)
Lymphocyte %: 22.5 %
Monocyte %: 8.3 %
Neutrophil %: 66.8 %
RBC: 3.23 10*6/uL — ABNORMAL LOW (ref 3.80–5.20)
WBC: 10 10*3/uL (ref 3.6–11.0)

## 2012-08-24 LAB — BASIC METABOLIC PANEL
BUN: 50 mg/dL — ABNORMAL HIGH (ref 7–18)
Calcium, Total: 8.4 mg/dL — ABNORMAL LOW (ref 8.5–10.1)
Chloride: 115 mmol/L — ABNORMAL HIGH (ref 98–107)
Co2: 19 mmol/L — ABNORMAL LOW (ref 21–32)
Osmolality: 297 (ref 275–301)
Potassium: 4.5 mmol/L (ref 3.5–5.1)
Sodium: 142 mmol/L (ref 136–145)

## 2012-08-24 LAB — VANCOMYCIN, TROUGH: Vancomycin, Trough: 12 ug/mL (ref 10–20)

## 2012-08-26 LAB — CULTURE, BLOOD (SINGLE)

## 2012-09-07 ENCOUNTER — Encounter: Payer: Self-pay | Admitting: Internal Medicine

## 2012-09-07 LAB — URINALYSIS, COMPLETE
Bilirubin,UR: NEGATIVE
Glucose,UR: 150 mg/dL (ref 0–75)
Ketone: NEGATIVE
Nitrite: POSITIVE
Ph: 6 (ref 4.5–8.0)
Protein: 100
RBC,UR: 1854 /HPF (ref 0–5)
Specific Gravity: 1.013 (ref 1.003–1.030)
Squamous Epithelial: NONE SEEN

## 2012-09-13 ENCOUNTER — Encounter: Payer: Self-pay | Admitting: Cardiothoracic Surgery

## 2012-09-20 ENCOUNTER — Encounter: Payer: Self-pay | Admitting: Nurse Practitioner

## 2012-09-20 ENCOUNTER — Encounter: Payer: Self-pay | Admitting: Cardiothoracic Surgery

## 2012-09-20 ENCOUNTER — Encounter: Payer: Self-pay | Admitting: Internal Medicine

## 2012-09-20 ENCOUNTER — Ambulatory Visit: Payer: Self-pay | Admitting: Internal Medicine

## 2012-12-23 IMAGING — CR DG CHEST 2V
1 series · 2 of 2 positions shown · non-contrast
Comparison: none

REASON FOR EXAM: cough
COMMENTS:

[Series 1: w chest lat · 0.14mm/px · 2 of 2 slices shown]
[im 1/2]
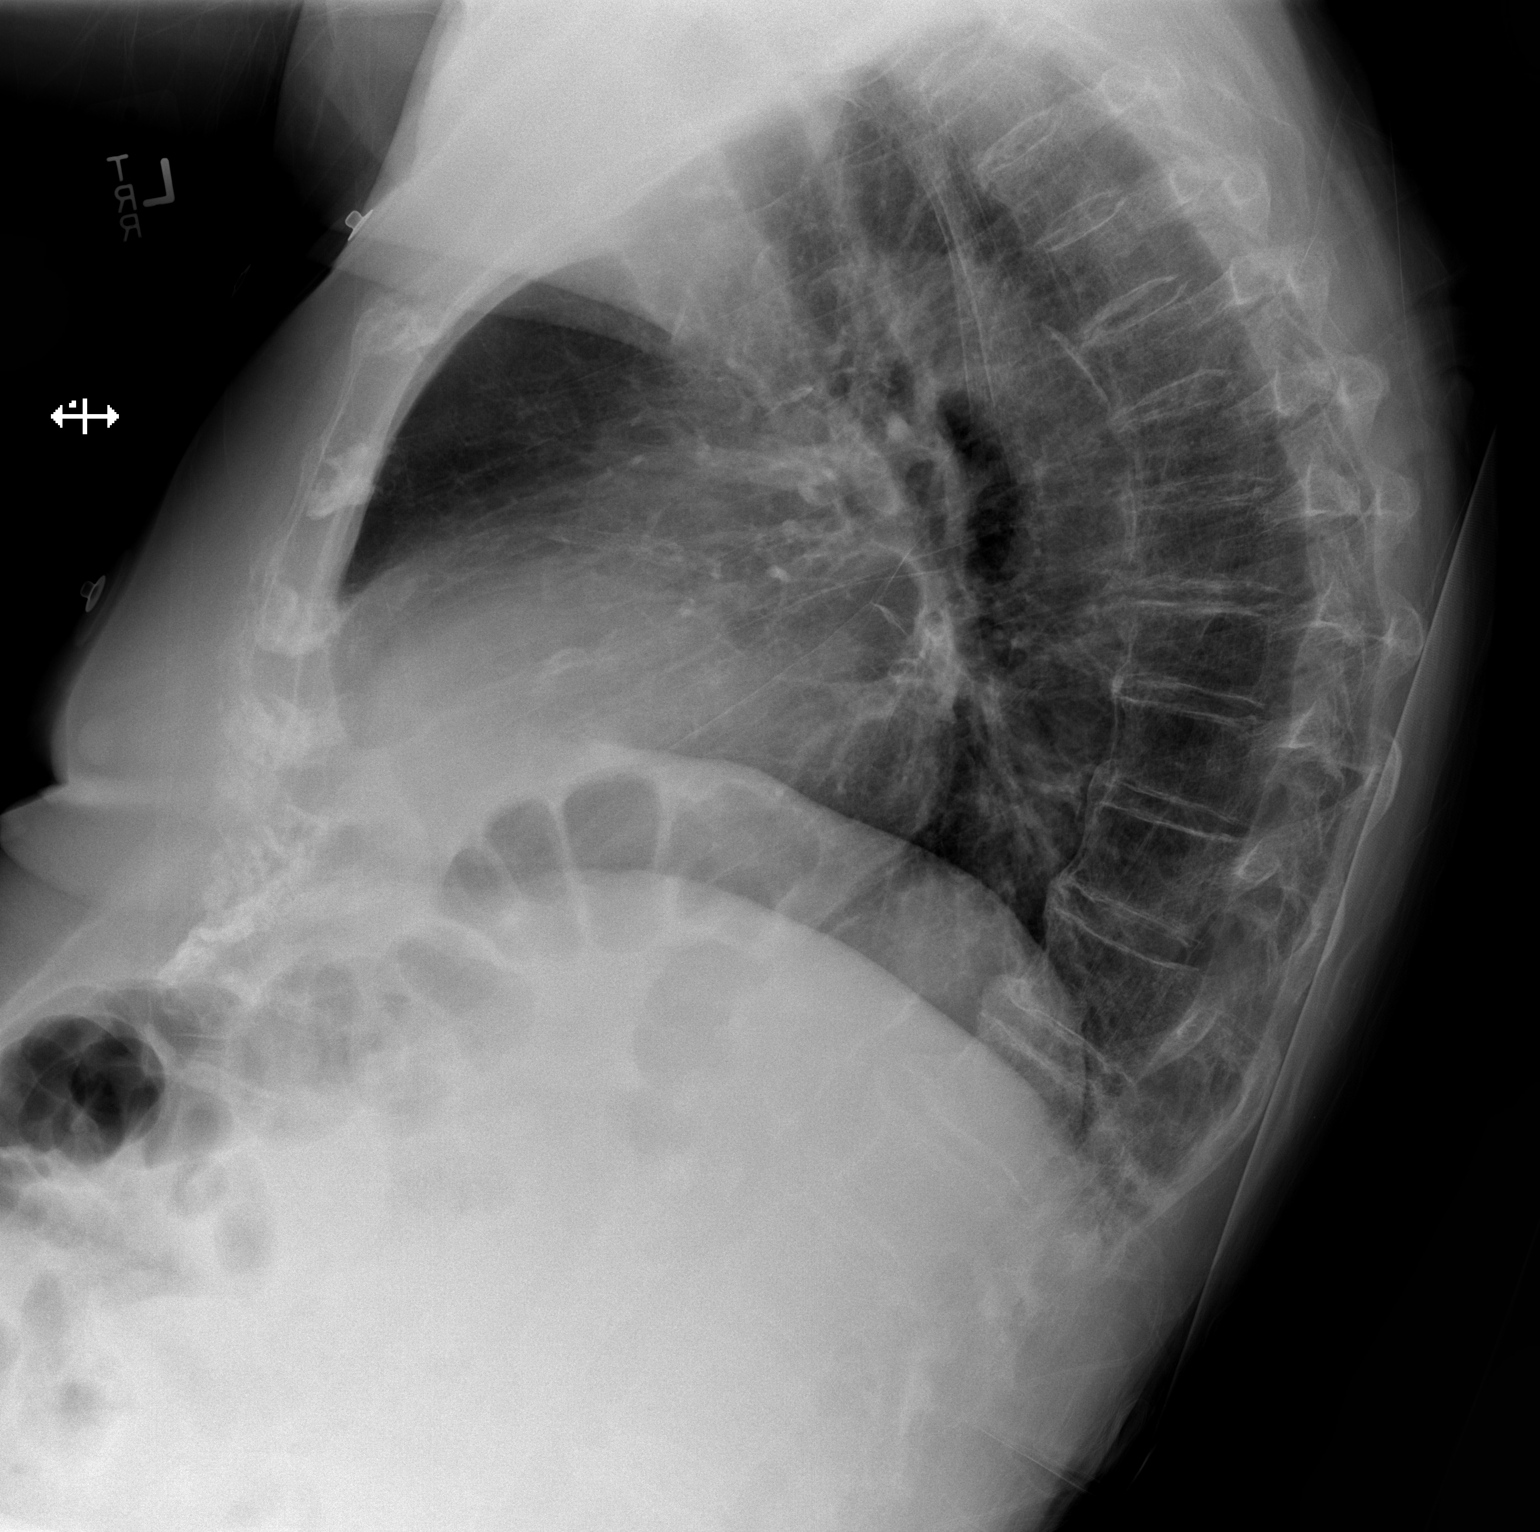
[im 2/2]
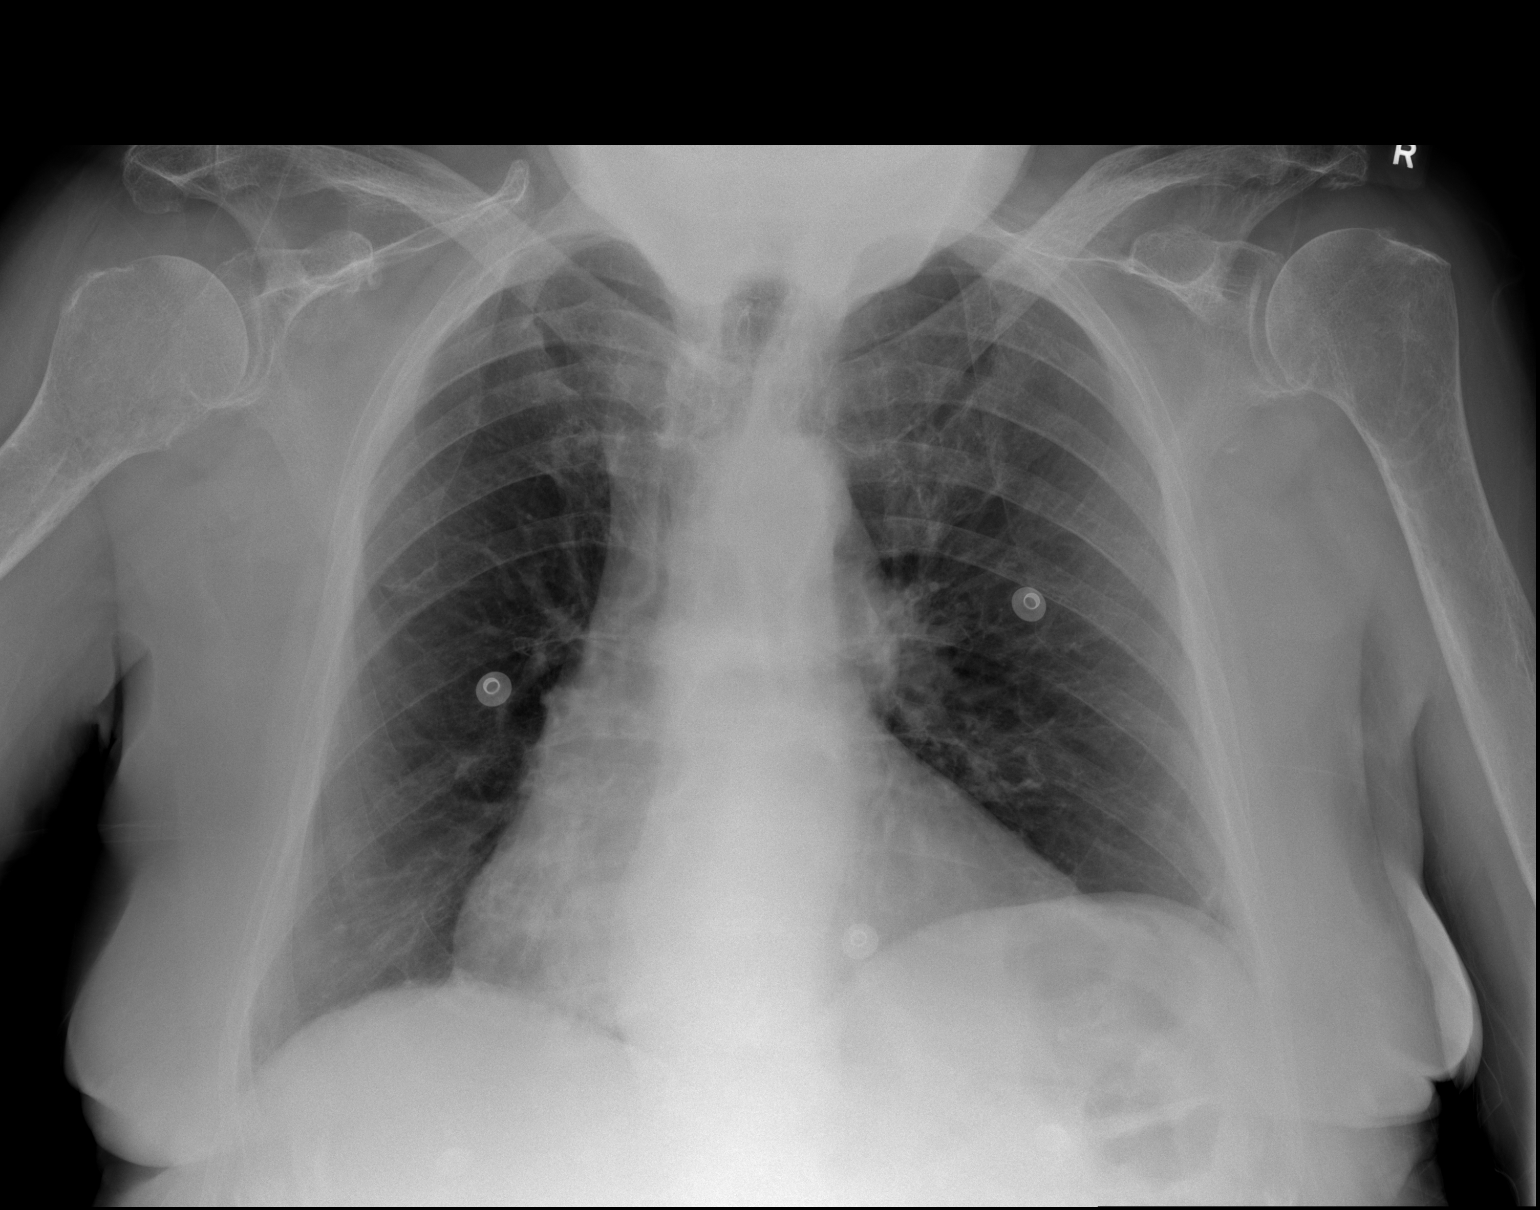

[2 of 2 positions shown; findings below may reference images not displayed]

PROCEDURE:     DXR - DXR CHEST PA (OR AP) AND LATERAL  - August 21, 2012  [DATE]

RESULT:     Comparison is made to a study 13 June, 2012.

The lungs are adequately inflated. The left hemidiaphragm diaphragm is
chronically higher than the right. There is no pleural effusion. The cardiac
silhouette is normal in size. The pulmonary vascularity is not engorged. The
bony thorax exhibits no acute abnormalities. The mediastinum is normal in
width. There is mild tortuosity of the descending thoracic aorta.
IMPRESSION: 1. There is no evidence of pneumonia or pulmonary edema or pleural effusions.
2. The cardiac silhouette is mildly enlarged but the pulmonary vascularity
is not engorged.

[REDACTED]

## 2013-07-21 DEATH — deceased

## 2015-03-09 NOTE — H&P (Signed)
PATIENT NAME:  Felicia Casey, Felicia Casey MR#:  [id number] DATE OF BIRTH:  January 13, 1917  DATE OF ADMISSION:  08/21/2012  PRIMARY CARE PHYSICIAN: Dr. Sampson Goon, [facility]   REASON FOR ADMISSION: Recurrent urinary tract infection with Methicillin-resistant Staphylococcus aureus and Enterococcus faecalis.   HISTORY OF PRESENT ILLNESS: Felicia Casey is a very nice 81 year old female who is actually in very good condition for her age. She has been treated on several occasions for urinary tract infections due to Methicillin-resistant Staphylococcus aureus and Enterococcus faecalis with trimethoprim/sulfamethoxazole, Augmentin, ciprofloxacin, and clindamycin. So far she has been taking doxycycline and Macrobid but she has not had good results. She is still having elevated white blood count and for the past three to four days the patient has been complaining of abdominal pain, malaise, generalized weakness, and mild dysuria. The patient has been seen by Dr. Sampson Goon who repeated a set of tests and she was told to come to the ER to get treated. On her sensitivities we can say that both bacteria are sensitive to linezolid for what the patient has been started on that medication. At this moment she is stable and she has been hemodynamically stable. She has been overall afebrile with a temperature of 98. She has not been tachycardic or tachypneic and her blood pressure has been actually on the high side. She is just not getting much better with the treatment with oral antibiotics. The patient is here in the company of her daughter and they have been doing okay so far.   REVIEW OF SYSTEMS: CONSTITUTIONAL: Denies any fever. Positive fatigue. Positive generalized weakness. Positive aches and pains of neck, behind the head, back, both hips, and abdomen. Denies any weight loss or weight gain. HEENT: The patient denies any blurry vision or double vision. She does have glaucoma and uses several drops. Denies any changes in her  vision status. HEENT: Denies any tinnitus. She has some mild congestion with a little bit of postnasal drip and hacking cough with clear phlegm. RESPIRATORY: Positive cough as mentioned above. Denies any wheezing, hemoptysis or pneumonia in the past. CARDIOVASCULAR: Denies any orthopnea, edema, chest pain, or palpitations. GI: Positive for mild abdominal pain, crampy, and little bit of GERD. Denies any melena or rectal bleeding. No jaundice. GU: Denies any hematuria. No kidney stones. Positive dysuria. Positive erythema of the groin due to yeast. Denies any vaginal bleeding. Negative polyuria, polydipsia, or polyphagia. No cold or heat intolerance. HEMATOLOGIC/LYMPHATIC: Positive anemia in the past but no easy bleeding or bruising. MUSCULOSKELETAL: As mentioned above, aches and pains in neck, back, hips. No gout. No edema of joints. NEUROLOGIC: No numbness, weakness. No CVA. No TIA. PSYCHIATRIC: Negative for insomnia, anxiety, or depression.   PAST MEDICAL HISTORY:  1. Type II diabetes, well controlled.  2. Hypertension.  3. Coronary artery disease, status post three stents.  4. Peripheral neuropathy.  5. Diabetic foot ulcers, chronic.  6. Anemia of chronic disease.  7. Hypothyroidism.  8. Chronic kidney disease.  9. Gastroesophageal reflux.  10. Glaucoma.  11. History of prior TIA.   ALLERGIES: The patient is allergic to ibuprofen and sulfa.   PAST SURGICAL HISTORY:  1. Three stents due to coronary artery disease. No history of heart attack.  2. Hysterectomy.  3. Tonsillectomy.  4. Femur fracture.   SOCIAL HISTORY: The patient lives here at assisted living facility and her daughters help take care of her. She is a widow. She has never smoked and she does not drink. She has been independent up  to now.   FAMILY HISTORY: Positive for tuberculosis and pneumonia in both parents. No coronary artery disease. No cancer.   MEDICATIONS:  1. Vitamin D 5000 units once a month.  2. Multivitamin.   3. Ophthalmic Refresh Eyes.  4. Nystop, nystatin, apply to affected areas. 5. Norvasc 2.5 mg once daily.  6. Mucinex 600 mg every 12 hours. 7. Metoprolol 100 mg once a day. 8. Tylenol p.r.n.  9. Macrobid 100 mg twice daily.  10. Levothyroxine 88 mcg once daily.  11. Latanoprost 0.005% each eye once daily.  12. Glipizide 2.5 mg once daily.  13. Fluticasone 50 mcg inhaler two sprays once daily.  14. Doxycycline 100 mg twice daily.  15. Lipitor 10 mg once daily.  16. Aspirin 81 mg once daily.  17. Albuterol p.r.n.   PHYSICAL EXAMINATION:   VITAL SIGNS: Blood pressure 178/77, has been as high as 195/81, pulse 67, respiratory rate 22, temperature 98.4.   GENERAL: The patient is alert and oriented x3 in no acute distress. No respiratory distress. Hemodynamically stable. Comfortable sitting on the bed. Denies any discomfort at this moment.   HEENT: Pupils are equal and reactive. Extraocular movements intact. Mucosa moist. No oropharyngeal exudates. No thrush. Pink conjunctivae. Anicteric sclerae.   NECK: Supple. No JVD. No thyromegaly. No adenopathy. Carotid bruits are negative. Trachea is central. Range of motion within normal limits.   CARDIOVASCULAR: Regular rate and rhythm. No murmurs, rubs, or gallops are appreciated. Apical heart tones are nondisplaced.   LUNGS: Clear without any significant wheezing or rhonchi, although there is a little bit of crackles in both bases and some upper respiratory secretions but overall the lung fields are clear. No dullness to percussion.   ABDOMEN: Soft, nontender, nondistended. No hepatosplenomegaly. No masses.   GENITAL: Negative for external lesions. The patient wears a diaper and she has some intertrigo of the groin.   EXTREMITIES: No edema, no cyanosis, no clubbing. Pulses +2. Deep tendon reflexes x2.   SKIN: She has heel ulcers that are covered at this moment. There are no signs of infection when examined.   NEUROLOGIC: Cranial nerves II  through XII are intact. Strength seems to be equal in four extremities. No significant focal findings.   LYMPHATICS: Negative for lymphadenopathy in neck, supraclavicular, or epitrochlear.   PSYCH: Mood is normal without any signs of depression or anxiety.   MUSCULOSKELETAL: No significant joint effusions or deformity.   LABORATORY, DIAGNOSTIC, AND RADIOLOGICAL DATA: Creatinine 2.76, potassium 5.7, CO2 20, white count 14.9, hemoglobin 10.5, platelets 137. Urinalysis done in the past has shown Methicillin-resistant and Enterococcus faecalis. They are both sensitive to linezolid today. Her white blood cells in the urine are more than 1000.   Chest x-ray without any significant acute abnormality or signs of consolidation.   ASSESSMENT AND PLAN: This is a 81 year old female with history of multiple medical problems including coronary artery disease, diabetes, hypertension, peripheral neuropathy, chronic kidney disease stage III, and diabetic foot ulcers who presented here to the ER sent by primary care physician, Dr. Sampson GoonFitzgerald, due to recurrent and failure to treat urinary tract infection with Methicillin-resistant Staphylococcus aureus and Enterococcus faecalis.  1. Urinary tract infection, recurrent, and failure to treat as an outpatient with oral medications. The patient was later treated with Augmentin and ciprofloxacin, now with doxycycline and Macrobid. Her white count is still 14,000 and she has significant systemic symptoms like malaise, decreased appetite, abdominal pain, aching muscles, etc. for which she is being treated now with linezolid which  will cover both bacteria. Due to the fact that the patient is on linezolid, we have to watch for pancytopenia. At this moment her white count is elevated, her hemoglobin is 10, and her platelets are 136 for what we are going to follow-up labs. She is not taking any other medications that will create any interaction with linezolid in the sense of  serotonin syndrome but if she is going to be put on any antidepressant or other medications like Benadryl, we need to watch closely. She is a patient of Dr. Sampson Goon and we are going to transfer care in the morning.  2. Coronary artery disease, is asymptomatic. The patient is on aspirin, beta-blocker, and statin.  3. Diabetes. It has been well controlled. Will continue current therapy and also insulin sliding scale.  4. Dyslipidemia. Continue Lipitor.  5. Hypertension. Continue metoprolol and Norvasc.  6. Intertrigo. Continue nystatin topically.  7. Upper respiratory symptoms due to probably viral infection versus allergies. At this moment we are going to treat symptomatically with Mucinex. Continue fluticasone for treatment of asthma/reactive airway disease as well as albuterol.  8. Glaucoma. Continue treatment with eyedrops.  9. Hypothyroidism. Continue treatment with levothyroxine.  10. Other medical problems seem to be stable.   CODE STATUS: The patient is a DO NOT RESUSCITATE.   TIME SPENT: I spent about 50 minutes with this patient today. Will transfer care to Dr. Sampson Goon in the morning.   ____________________________ Felipa Furnace, MD rsg:drc D: 08/21/2012 21:43:19 ET T: 08/22/2012 08:26:30 ET JOB#: [id number]  cc: Felipa Furnace, MD, <Dictator> Stann Mainland. Sampson Goon, MD Regan Rakers Juanda Chance MD ELECTRONICALLY SIGNED 08/22/2012 14:16

## 2015-03-09 NOTE — Discharge Summary (Signed)
PATIENT NAME:  Felicia NakayamaBRUNER, Kelsa C MR#:  [id number] DATE OF BIRTH:  1917-05-29  DATE OF ADMISSION:  07/16/2012 DATE OF DISCHARGE:  07/24/2012  ADDENDUM:  Original Discharge Summary was dated 07/20/2012.   HOSPITAL COURSE: The patient was kept in the hospital over the weekend for altered mental status likely due to a dose or two of Haldol she received.  She also continued to work with physical therapy.  Her mental status was markedly improved, and on the day of discharge she was afebrile with vital signs stable.  She was eating well.  She had no active complaints.    DISCHARGE LABORATORY, DIAGNOSTIC AND RADIOLOGICAL DATA: On the day of discharge, white blood count was 9.5, hemoglobin 9.3, platelets of 163.  Her discharge BUN was 29, creatinine 2.08.  The rest of her basic panel was within normal limits.   DISCHARGE DIAGNOSES: (Addendum) 1. Urinary tract infection: Her urinary tract infection has been treated, but she will require Augmentin, Ciprofloxacin and fluconazole until 07/29/2012, at which point these antibiotics can be stopped. 2. Diabetes: Her sugars had been low, so we had held her Actos. We have also decreased her glipizide, and she will remain on 2.5 mg to be given in the morning.   DISCHARGE MEDICATIONS: The rest of her discharge medications are the same as on her prior dictated Discharge Summary.   FOLLOWUP: The patient should follow up with me within 1 to 2 weeks of discharge.  ____________________________ Stann Mainlandavid P. Sampson GoonFitzgerald, MD dpf:cbb D: 07/24/2012 12:08:12 ET T: 07/24/2012 12:13:19 ET JOB#: [id number]  cc: Stann Mainlandavid P. Sampson GoonFitzgerald, MD, <Dictator> DAVID Sampson GoonFITZGERALD MD ELECTRONICALLY SIGNED 07/29/2012 22:11

## 2015-03-09 NOTE — Discharge Summary (Signed)
PATIENT NAME:  Felicia Casey, Felicia Casey MR#:  [id number] DATE OF BIRTH:  [birth year]  DATE OF ADMISSION:  08/21/2012 DATE OF DISCHARGE:  08/26/2012  PRIMARY CARE PHYSICIAN: Felicia Braunavid Calven Gilkes, MD  DISCHARGE DIAGNOSES:  1. Urinary tract infection.  2. Acute renal failure.  3. Altered mental status.  4. Hypertension.  5. Diabetes.  6. Debility.  HISTORY OF PRESENT ILLNESS: Ms. Felicia Casey is a 81 year old who resides at an assisted living facility. She has had issues with recurrent urinary tract infections. Recently she was diagnosed with MRSA and enterococcus urinary tract infection and was on antibiotics for this. Over several days prior to admission, she developed upper respiratory infection symptoms and decreased oral intake. She then developed fevers and altered mental status. She came to the emergency room where she was found to have altered mental status. Her white count was elevated at 14.9. Urinalysis was floridly positive. Her creatinine was elevated to 2.76. Her potassium was also elevated at 5.7. She was admitted for further management.   HOSPITAL COURSE BY ISSUE: 1. Urinary tract infection. She has had multiple pathogens grow in the past. Urine culture obtained at this admission Klebsiella pneumoniae. Fortunately, it was a very sensitive organism sensitive to cefazolin, ceftriaxone, ciprofloxacin, gentamicin, imipenem, Bactrim, and levofloxacin and resistant to nitrofurantoin and ampicillin.  Of note, she had been on doxycycline and Macrobid as an outpatient for the MRSA and enterococcus urinary tract infection. During this hospitalization, she was initially started on Zyvox. However, this was changed to vancomycin and ceftriaxone was added. She received five days of vancomycin which was discontinued on 08/25/2012. She received five days of ceftriaxone which was also discontinued on 08/25/2012. She has been transitioned to oral Keflex, 500 mg three times daily.  2. Acute renal failure and dehydration.  The patient received IV fluids however, her BUN was quite elevated at first. With continued IV fluids her mental status improved and her creatinine decreased to 1.99.  3. Altered mental status. This improved with IV fluids and treatment of her urinary infection.  4. Debility. The patient was seen by physical therapy who recommended skilled nursing facility short-term rehab.  5. Diabetes. The patient's glipizide was held as her sugars were well controlled and she was not eating. We will restart it now as an outpatient.  6. Also for hypertension, the patient has continued elevated blood pressure, but she had been recently started on Norvasc recently and we will continue that.  DISCHARGE MEDICATIONS:  1. Keflex 500 mg three times daily for five days.  2. Glipizide 2.5 mg once a day.  3. Levothyroxine 88 mcg orally once a day.  4. Nystop powder to the groin once a day for fungal infection.  5. Aspirin 81 mg once a day.  6. Fluticasone 50 mg spray two sprays once a day.  7. Metoprolol 100 mg once a day of the extended-release.  8. Tab-A-Vite oral vitamin once a day.  9. Refresh ophthalmologic solution one drop to each eye twice a day.  10. Nystatin triamcinolone cream apply to affected area under breast two times a day.  11. Tylenol 650 mg extended-release 1 tablet three times daily for pain.  12. Atorvastatin 110 mg once a day.  13. Lansoprazole 0.005% one drop to each eye at bedtime.  14. Norvasc 2.5 mg once a day.  15. Mucinex 1 tablet every 12 hours.  DISCHARGE DISPOSITION: The patient will be discharged to a skilled nursing facility for physical therapy. The plan there will be to have her return  home with 24/7 care.   DISCHARGE FOLLOWUP: The patient will followup with Dr. Dareen Casey at [facility]. She will also follow up with Dr. Sampson Casey after she goes home.        ACTIVITY: As tolerated.  TIME SPENT: 40 minutes. ____________________________ Stann Mainland. Felicia Goon,  MD dpf:slb D: 08/26/2012 08:57:38 ET T: 08/26/2012 09:14:49 ET JOB#: [id number]  cc: Stann Mainland. Felicia Goon, MD, <Dictator> Jaelon Gatley Felicia Goon MD ELECTRONICALLY SIGNED 08/27/2012 21:46

## 2015-03-09 NOTE — Discharge Summary (Signed)
PATIENT NAME:  Felicia Casey, Felicia Casey MR#:  [id number] DATE OF BIRTH:  1917-04-25  DATE OF ADMISSION:  07/16/2012 DATE OF DISCHARGE:  07/20/2012  DISCHARGE DIAGNOSES:  1. Urinary tract infection. 2. Acute renal failure. 3. Anemia. 4. Altered mental status. 5. Hypertension. 6. Diabetes.  HISTORY OF PRESENT ILLNESS: The patient is a 81 year old female with hypertension, diabetes, and recurrent urinary tract infections who was admitted from the clinic with symptoms of altered mental status, cloudy urine, nausea, findings of an elevated creatinine and positive urinalysis. Please see full admission history from 07/16/2012 for details.    HOSPITAL COURSE BY ISSUES: 1. Urinary tract infection. The patient was initially started on Zosyn. Cultures done at [facility] revealed mixed flora. Cultures done in the hospital after she received Zosyn were negative, except for yeast. She was treated initially with Zosyn and got one dose of vancomycin. Fluconazole was also added to her regimen. Since she sterilize her urine with the Zosyn, we will switch her to an oral therapeutic that most closely mimics the Zosyn coverage. I put her on Augmentin 500 mg twice a day and ciprofloxacin 250 mg twice a day for 10 day total. We will also continue the fluconazole for 10 day total. I am concerned there could be some resistant pathogens, however, we will plan to repeat a UA and urine culture a week after stopping her antibiotics. 2. Acute renal failure. Her creatinine is usually approximately 1.5.  It increased to 2.7 and continued to increase for two days during the hospitalization, but has started to trend downward and is 2.6 today. This was likely due to volume depletion. We will recheck it in the morning and discharge her with a followup creatinine next week.  3. Anemia. This has been slowly progressive over months to years. She received a unit of blood while in house and her hemoglobin increased to 8.0.  We will repeat a  CBC as an outpatient.  4. Hypertension. She remains on metoprolol 100 mg once a day. 5. Diabetes. She did have episodes of low glucose in the hospital. Her pioglitazone was held.  She was continued on just glipizide 5 mg a day.  6. Altered mental status. This improved once she was treated for her urinary tract infection.   DISCHARGE PHYSICAL EXAMINATION: The patient was sitting up today. She was alert, interactive, and oriented. She knew that today was her 89rd birthday. Her heart was regular. Her lungs were clear. Her abdomen was soft and nontender and she had no edema.   DISCHARGE LABS: Hemoglobin posttransfusion 8.0. White blood count is 12.5, which is decreased from admission at 13. Her hemoglobin is 8.0 and platelets of 161. Basic metabolic panel reveals a BUN of 33 and a creatinine of 2.6.   Her urine culture is growing yeast.    DISCHARGE DIET: Diabetic diet, regular consistency.  DISCHARGE MEDICATIONS: 1. Augmentin 500 mg twice a day for 10 days total. 2. Ciprofloxacin 250 mg twice a day for 10 days. 3. Fluconazole 200 mg daily for 10 days. 4. Metoprolol 100 mg once a day. 5. Glipizide 5 mg once a day. 6. Plavix 75 mg once a day.  7. Atorvastatin 10 mg once a day. 8. Fluticasone nasal spray two sprays once a day. 9. Latanoprost ophthalmic solution one drop to each eye once a day. 10. Tab-A-Vite multivitamin. 11. Nystatin cream and powder to be applied to the groin and under the breasts as needed. 12. Tylenol for pain. 13. Sodium chloride 5% ophthalmic solution.  14. Levothyroxine 88 mcg daily. 15. Atrovent two puffs every 4 hours as needed. 16. Vitamin D2 5000 international units once a month.  NOTE: The patient will stop her Actos.   HOSPITAL FOLLOWUP: The patient should followup with Dr. Sampson Goon within 1 to 2 weeks of discharge. The family is advised that if she has recurrent altered mental status, decreased urine output, fevers or other concerns or symptoms she should  present for care.   CODE STATUS: The patient is DO NOT RESUSCITATE, as discussed with the patient and her family.   TIME SPENT: 30 minutes.  ____________________________ Stann Mainland. Sampson Goon, MD dpf:slb D: 02-Oct-202013 16:10:00 ET T: 07/20/2012 13:50:44 ET JOB#: [id number]  cc: Stann Mainland. Sampson Goon, MD, <Dictator> Felicia Casey Sampson Goon MD ELECTRONICALLY SIGNED 07/29/2012 22:10

## 2015-03-09 NOTE — H&P (Signed)
PATIENT NAME:  Felicia NakayamaBRUNER, Dulcey C MR#:  [id number] DATE OF BIRTH:  08-13-1917  DATE OF ADMISSION:  07/16/2012  PRIMARY CARE PHYSICIAN: Dr. Clydie Braunavid Tanishi Nault   ADMITTING DIAGNOSIS: Acute renal failure, urosepsis, altered mental status.   HISTORY OF PRESENT ILLNESS: Ms. Felicia Casey is a 81 year old female resident of [facility] who has a history of coronary artery disease, cardiomyopathy, hypertension, type 2 diabetes, diabetic foot ulcers, hypothyroidism, chronic kidney disease, who was brought to clinic today by her daughter from [facility] for altered mental status. She apparently has had a fever for a few days although that resolved. She then became increasingly lethargic. She has had blood in her urine. She has had nausea but no vomiting. She has not been drinking as much as she usually does. She had a urinalysis done that was positive. She was brought in because of increasing lethargy.  She was seen in clinic today where she was hemodynamically stable but confused and lethargic.   PAST MEDICAL HISTORY: 1. Coronary artery disease. 2. Type 2 diabetes.  3. Hypertension. 4. Diabetic peripheral neuropathy. 5. Diabetic foot ulcers, chronic. 6. Hypothyroidism.  7. Anemia of chronic disease.  8. Chronic kidney disease.  9. Gastroesophageal reflux disease. 10. Glaucoma. 11. History of transient ischemic attack.   PAST SURGICAL HISTORY: 1. Tonsillectomy.  2. Hysterectomy. 3. Stent for coronary artery disease.  4. Femur fracture surgery.  SOCIAL HISTORY: The patient is a resident of [facility]. She is cared for as well by her daughter who is very involved in her care. She is widowed. She is a nonsmoker and does not drink alcohol.  FAMILY HISTORY: Father died from tuberculosis. Mother died from pneumonia.   REVIEW OF SYSTEMS: Unable to be obtained.   CURRENT MEDICATIONS: 1. Atorvastatin 10 mg by mouth. 2. Plavix 75 mg once a day. 3. Fluticasone 2 sprays each  nostril daily. 4. Glipizide 5 mg 1 tablet daily. 5. Lansoprazole 0.005% instill 1 drop both eyes at bedtime.  6. Levothyroxine 66 mg tablet daily. 7. Metoprolol 100 mg daily. 8. Nystatin cream. 9. Nystop wound powder. 10. Tylenol p.r.n. 11. Pioglitazone 15 mg daily. 12. Truvite. 14. Benadryl p.r.n.  15. Atrovent 2 puffs q.4 hours p.r.n.  16. Vitamin D 1 tablet 50,000 units by mouth on Mondays. 17. Phenergan p.r.n.   ALLERGIES: Patient is allergic to sulfa.   CODE STATUS: Patient is DO NOT RESUSCITATE.   PHYSICAL EXAMINATION: VITAL SIGNS: Afebrile, heart rate 64, blood pressure 127/60, respirations 16, sat 96%.  GENERAL: She is lethargic, sitting in a wheelchair but is able to be aroused.   HEENT: Pupils equal, round, reactive to light and accommodations. Extraocular movements visualized. Sclera anicteric. Oropharynx mucous membranes are dry.   HEART: Regular.  LUNGS: Clear.  ABDOMEN: Soft, nondistended, nontender.  EXTREMITIES: She has no clubbing, cyanosis or edema.  SKIN: She has bilateral heel decubitus ulcers that have some white necrotic material but at the base is quite pink and is healing from her prior wounds.   NEUROLOGICAL: She is not oriented to place or year but she is oriented to her daughter's name. She is able to move all four extremities.  LABORATORY, DIAGNOSTIC AND RADIOLOGICAL DATA: White blood count 13.2, hemoglobin 8.1, MCV 89, platelets 173, sodium 132, potassium 5.4, chloride 105, bicarbonate 21, BUN 38, creatinine 2.7, calcium 9.2. Urinalysis 150 WBCs, 3+ leukocyte esterase, 21 to 50 red blood cells, nitrates negative.   IMPRESSION AND PLAN: This is an elderly woman with multiple medical problems admitted  with:  1. Altered mental status, acute renal failure and urosepsis. Admit to medical floor. Give IV fluid hydration 200 mL/h for one liter then 125 mL/h overnight. Will start her on Zosyn antibiotic wise to cover broadly until urine culture results are  available. Of note, she was on amoxicillin suppressive therapy apparently since her last admission when she had enterococcal urosepsis. Will continue her on her diabetic medications including the glipizide and the pioglitazone. Continue her on sliding scale insulin as well.  2. Hypertension. Hold the patient's metoprolol for now but restart is once she is stable.  3. Hyperlipidemia. Will continue her on simvastatin.  4. CODE STATUS: Patient is DO NOT RESUSCITATE.   ____________________________ Stann Mainland. Sampson Goon, MD dpf:cms D: 07/16/2012 21:30:00 ET T: 07/17/2012 05:27:28 ET JOB#: [id number] cc: Stann Mainland. Sampson Goon, MD, <Dictator> Trampas Stettner Sampson Goon MD ELECTRONICALLY SIGNED 07/29/2012 22:10

## 2015-03-14 NOTE — H&P (Signed)
PATIENT NAME:  Felicia NakayamaBRUNER, Felicia Casey MR#:  [id number] DATE OF BIRTH:  Nov 03, 1917  DATE OF ADMISSION:  05/21/2012  PRIMARY CARE PHYSICIAN: Felicia Parison Chaplin, MD  CHIEF COMPLAINT: Bladder distention, unable to void, fever, and mental status change.   HISTORY OF PRESENT ILLNESS: Ms. Felicia Casey is a 81 year old Caucasian female, a resident of [facility]. She was transferred to the Emergency Room for evaluation of inability of the patient to empty her bladder. She felt swollen in the pelvic area, that her pants are getting tight on her, reported having fever earlier and the daughter noticed mental status change. She is confused saying crazy things about people taking her home. Evaluation here with bladder scanner showed urinary retention of about 450 mL, and upon insertion of the Foley catheter, gross blood urine came out and there was about 200 mL immediately in the urinary bag. The patient was admitted for further evaluation and management. It is worth mentioning that the patient has seen Dr. Evelene CroonWolff, her urologist, on an outpatient basis and we will consider reconsulting him as an inpatient.   REVIEW OF SYSTEMS: CONSTITUTIONAL: The family reported there was fever earlier. The patient does not give me adequate history whether she has chills or not. She has fatigue. EYES: No blurring of vision. No double vision. ENT: No hearing impairment. No sore throat. No dysphagia. CARDIOVASCULAR: No chest pain. Denies any shortness of breath. No cough. No syncope. RESPIRATORY: Denies any cough. No hemoptysis. No shortness of breath. No chest pain. GASTROINTESTINAL: Has nausea but no vomiting. No abdominal pain other than the suprapubic tenderness. No diarrhea. GENITOURINARY: Has urinary retention and suprapubic pain. Gross hematuria as above. MUSCULOSKELETAL: No joint swelling or pain. No muscular pain or swelling. INTEGUMENTARY: No skin rash, but she has ulcers on her feet from diabetic complications. There are taken  care of by the Wound Clinic. NEUROLOGY: No focal weakness. No seizure activity. No headache. PSYCHIATRY: No anxiety or depression. ENDOCRINE: No polyuria or polydipsia. No heat or cold intolerance.   PAST MEDICAL HISTORY:  1. Coronary artery disease. 2. Hypertrophic cardiomyopathy.  3. Systemic hypertension.  4. Diabetes mellitus, type II. 5. Diabetic peripheral neuropathy. 6. Diabetic foot ulcers. 7. Hypothyroidism. 8. Anemia of chronic disease.  9. Chronic kidney disease, ranging between stage III to stage IV. 10. Gastroesophageal reflux disease.  11. History of transient ischemic attack.  12. History of glaucoma.   PAST SURGICAL HISTORY:  1. Tonsillectomy and adenoidectomy. 2. Hysterectomy. 3. Stent for coronary artery disease.  4. History of surgery for femur fracture.   SOCIAL HISTORY: The patient used to be at [facility], but then transferred to [facility]. Her care therefore was transferred from Dr. Tillman Abideichard Letvak back to Dr. Conchita Parison Casey again. The patient is widowed. She is cared for by her daughter, Felicia Casey, who has the power of attorney.   SOCIAL HABITS: Nonsmoker, never smoked. No history of alcohol abuse.   FAMILY HISTORY: According to the records, her father died from tuberculosis. However, the patient's daughter is unsure about this information. The patient's mother died from complications of pneumonia.   ADMISSION MEDICATIONS: These are verified. The family did not bring any records nor did the nursing home send the records. We will call the nursing home to obtain her updated medications.  For the time being we have the following medications:  1. Simvastatin 20 mg at bedtime.  2. Synthroid 88 mcg once a day. 3. Plavix 75 mg a day.  4.  Actos 15 mg once a day. 5. Glipizide 5 mg a day.  6. Metoprolol succinate extended-release 100 mg a day. 7. Tylenol p.r.n.  8. Vitamin D 50,000 once a month.   ALLERGIES: According to the records  sulfa, penicillin, and ibuprofen.   PHYSICAL EXAMINATION:   VITAL SIGNS: Blood pressure 159/71, respiratory rate 18, pulse 95, temperature is down now to 98.3, and oxygen saturation 95%.   GENERAL APPEARANCE: Elderly febrile lady, pale-looking, lying down on her bed, looks lethargic, in no acute distress.   HEAD AND NECK: There is moderate pallor. No icterus, no cyanosis.   EAR, NOSE, AND THROAT: Hearing was normal. Nasal mucosa, lips, and tongue were normal. The patient has dentures.   EYES: Normal iris and conjunctivae. Pupils are about 5 mm, sluggishly reactive to light.   NECK: Supple. Trachea at midline. No thyromegaly. No cervical lymphadenopathy. No masses.   HEART: Normal S1 and S2. No S3 or S4. No murmur. No gallop. No carotid bruits.   RESPIRATORY: Normal breathing pattern without use of accessory muscles. No rales. No wheezing.   ABDOMEN: Soft. There is tenderness at the suprapubic area and on the sides of the lower abdomen. No hepatosplenomegaly. No rigidity. No rebound. No hernias.   SKIN: No skin rash. She has multiple foot ulcers in both feet.   MUSCULOSKELETAL: No joint swelling. No clubbing.   NEUROLOGIC: Cranial nerves II through XII are intact. No focal motor deficit.   PSYCHIATRY: The patient is alert and oriented to place and people. Mood and affect were flat.   LABORATORY AND DIAGNOSTICS: EKG showed normal sinus rhythm at rate of 75 per minute. Unremarkable EKG.  Sugar 144, BUN 37, and creatinine 2.3; her baseline creatinine in January of this year was 1.9. Estimated GFR 18; her previous GFR was 26. Serum sodium 134 and potassium 5.5. Liver function tests were normal. CBC showed elevated white count of 16,000 and hemoglobin 8.6 with hematocrit of 27; her baseline hemoglobin in January of this year was 9. Platelet count 174.   Urinalysis showed too numerous to count white blood cells, too numerous to count red blood cells, and +2 bacteria.   ASSESSMENT:   1. Sepsis.  2. Urinary tract infection. It is worth to mention that the patient was admitted last year, in August 2012, with similar picture and the records from her urine was Escherichia coli. Since that time, she has had different bacteria causing her urinary tract infection. These are as follows: Enterococcus, Klebsiella, and then MRSA.  3. Altered mental status, likely from her underlying infection.  4. Gross hematuria. This could be secondary to hemorrhagic cystitis, but I cannot rule out bladder lesion or tumor or other etiologies.  5. Acute on chronic renal failure with history of chronic kidney disease, stage IV. 6. Hyperkalemia with potassium ranging 5.5.  7. Anemia from chronic diseases. Her baseline hemoglobin was 9 and now it is 8.6. It is normocytic, normochromic anemia.  8. Her other medical problems are as follows: Coronary artery disease, hypertrophic cardiomyopathy, hypertension, diabetes mellitus type II, diabetic peripheral neuropathy, hypothyroidism, gastroesophageal reflux disease, history of transient ischemic attack, and history of glaucoma.   PLAN: We will admit the patient to the medical floor. Gentle IV hydration. Repeat basic metabolic profile tomorrow to follow up on the potassium. Right now there is no urgency to treat the 5.5 potassium with normal EKG findings. Consult urology, Dr. Evelene Croon, since he had seen her as her urologist before. Keep the Foley catheter in. Urine  was sent for culture and two blood cultures as well. Empiric antibiotic using both Rocephin 1 gram daily and vancomycin also to cover for possibility of MRSA until we get results of the urine culture.  I would like to mention that the patient's code status is DO NOT RESUSCITATE as confirmed by her daughter, Sanaiyah Kirchhoff, who has the power of attorney. She also indicated that her mother does not have a Living Will, but she did appoint her as power of attorney. I would also like to mention that I       am  holding the glipizide temporarily until improvement in her renal function since decline in her GFR may result in hypoglycemia with glipizide.   TIME SPENT EVALUATING THIS PATIENT: More than one hour.  ____________________________ Carney Corners. Rudene Re, MD amd:slb D: 05/21/2012 23:29:46 ET T: 05/22/2012 07:17:13 ET JOB#: [id number]  cc: Carney Corners. Rudene Re, MD, <Dictator> Jimmie Molly. Candelaria Stagers, MD Zollie Scale MD ELECTRONICALLY SIGNED 05/25/2012 22:14

## 2015-03-14 NOTE — Discharge Summary (Signed)
PATIENT NAME:  Felicia Casey, [name] MR#:  [id number] DATE OF BIRTH:  08-03-1917  DATE OF ADMISSION:  05/21/2012 DATE OF DISCHARGE:  05/28/2012  HISTORY OF PRESENT ILLNESS: For a complete history of present illness, please see dictated note by Dr. Marlaine HindAmir Darwish dated 05/21/2012.   HOSPITAL COURSE: This is a very pleasant 81 year old female with multiple medical problems who was brought to the emergency room from [facility] with altered mental status as well as decreased urine output and hematuria.  She also has a history of fevers. In the emergency room, she was confused and noted to have retained urine. A Foley was placed and she had bloody urine noted.  She at that time had a white blood cell elevated at 16,000 and a creatinine of 2.3.  Urinalysis showed too numerous to count white blood cells.   The patient was admitted with diagnosis of urosepsis and altered mental status and urinary obstruction. She had a Foley catheter in place and received IV fluids as well as IV ceftriaxone and vancomycin.    By the next morning she had improved in terms of her mental status, however, she still had gross hematuria in her urine.  She was also noted to have bilateral heel decubitus ulcers which are chronic. These were tended to by wound consult. Her leukocytosis improved and her renal function also improved.  On 05/27/2012, the urinary catheter was removed and she has been able to produce nonbloody urine since that time.    On the day of discharge, she is alert and oriented, afebrile. Vital signs are stable.   Her urine culture ended up growing enterococcus greater than 100,000 which was sensitive to amoxicillin and vancomycin and nitrofurantoin.  Her antibiotics were changed to oral amoxicillin on 05/27/2012.  She does have a history of recurrent urinary tract infections and has been on suppressive therapy for this in the past.    DISCHARGE DIAGNOSES:  1. Urosepsis secondary to enterococcus with  negative blood cultures, improvement with IV antibiotics. 2. Altered mental status due to urosepsis. 3. Candidiasis of the groin. 4. Acute renal failure. 5. Chronic heel decubitus ulcers.  6. Urinary obstruction. 7. Gross hematuria. 8. Diabetes. 9. Hypertension.  DISCHARGE DIET: Diabetic diet.   ACTIVITY: As tolerated.   DISCHARGE MEDICATIONS:  1. Amoxicillin 875 mg p.o. twice a day x10 days then one tablet a day for 10 days. 2. Fluconazole 150 mg p.o. daily x1 day. 3. Nystatin ointment applied to the groin three times daily p.r.n.  4. Levothyroxine 88 mcg once a day. 5. Fluticasone spray 2 sprays to each nostril every day. 6. Plavix 75 mg once a day. 7. Metoprolol 100 mg once a day. 8. Actos 15 mg once a day. 9. Tab-A-Vite oral tablet one tablet once a day. 10. Artificial Tears one drop into each eye twice daily. 11. Mycolog II apply to areas under breasts twice daily. 12. Tylenol Arthritis Pain 650 mg three times daily. 13. Lipitor 10 mg once a day. 14. Latanoprost 0.005% eye solution one drop in each eye at bedtime. 15. Sodium chloride 5% ointment applied to both eyes at bedtime. 16. Atrovent 2 puffs every 4 hours p.r.n.  17. Bisac-Evac 10 mg suppository every 6 hours p.r.n.  18. Dermagran topical ointment applied to buttocks p.r.n.  19. Guaifenesin one tablet twice daily as needed. 20. Glipizide 5 mg once a day as needed.  DISCHARGE FOLLOWUP: The patient is to followup with Dr. Sampson [facility]oonFitzgerald [facility] within the next 2  weeks for recheck of blood work as well as repeat urine evaluation and consideration of suppressive antibiotic therapy.   DISCHARGE LOCATION: The patient is being discharged to [facility] skilled nursing facility.   CODE STATUS: The patient is DO NOT RESUSCITATE.   TIME SPENT ON DISCHARGE: 40 minutes.  ____________________________ Stann Mainland. Sampson Goon, MD dpf:slb D: 05/28/2012 15:40:29 ET T: 05/28/2012 16:24:50 ET JOB#: [id number]  cc: Stann Mainland.  Sampson Goon, MD, <Dictator> Adelfo Diebel Sampson Goon MD ELECTRONICALLY SIGNED 06/13/2012 15:14
